# Patient Record
Sex: Female | Born: 1995 | Race: White | Hispanic: No | Marital: Married | Smoking: Never smoker
Health system: Southern US, Community
[De-identification: ages and names within clinical notes are randomized; demographics above are authoritative.]

## PROBLEM LIST (undated history)

## (undated) DIAGNOSIS — Z8489 Family history of other specified conditions: Secondary | ICD-10-CM

## (undated) DIAGNOSIS — J353 Hypertrophy of tonsils with hypertrophy of adenoids: Secondary | ICD-10-CM

## (undated) DIAGNOSIS — T4145XA Adverse effect of unspecified anesthetic, initial encounter: Secondary | ICD-10-CM

## (undated) DIAGNOSIS — T8859XA Other complications of anesthesia, initial encounter: Secondary | ICD-10-CM

## (undated) HISTORY — PX: WISDOM TOOTH EXTRACTION: SHX21

---
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
## 2016-03-30 ENCOUNTER — Emergency Department
Admission: EM | Admit: 2016-03-30 | Discharge: 2016-03-30 | Disposition: A | Discharge status: Home / Self Care | Payer: Self-pay | Attending: Student in an Organized Health Care Education/Training Program | Admitting: Student in an Organized Health Care Education/Training Program

## 2016-03-30 ENCOUNTER — Encounter: Payer: Self-pay | Admitting: Emergency Medicine

## 2016-03-30 DIAGNOSIS — R0989 Other specified symptoms and signs involving the circulatory and respiratory systems: Secondary | ICD-10-CM | POA: Insufficient documentation

## 2016-03-30 DIAGNOSIS — J029 Acute pharyngitis, unspecified: Secondary | ICD-10-CM

## 2016-03-30 DIAGNOSIS — R09A2 Foreign body sensation, throat: Secondary | ICD-10-CM

## 2016-03-30 LAB — POCT RAPID STREP A: STREPTOCOCCUS, GROUP A SCREEN (DIRECT): NEGATIVE

## 2016-03-30 MED ORDER — FIRST-DUKES MOUTHWASH MT SUSP: 5.0000 mL | Freq: Four times a day (QID) | OROMUCOSAL | 0 refills | Status: DC | PRN

## 2016-03-30 NOTE — ED Provider Notes (Signed)
[facility] Emergency Department Provider Note  ____________________________________________  Time seen: Approximately 3:37 PM  I have reviewed the triage vital signs and the nursing notes.   HISTORY  Chief Complaint Sore Throat    HPI Kaitlyn Hamilton is a 20 y.o. female , NAD, presents to the emergency department with 1 month history of irritated throat. Patient states she had a popcorn kernel stuck in the right tonsil approximately one month ago. Saw her dentist to attempted to irrigate and flush the kernel out of the tonsil but they're uncertain if it was removed. Patient notes she's had irritation about the right side of the throat since that time. Has not noted any redness or swelling. Does feel a foreign body sensation when she swallows at times but has had no difficulty eating or drinking. Has not had any fevers, chills, body aches. No shortness of breath or wheezing. Denies chest pain or neck pain.   History reviewed. No pertinent past medical history.  Patient Active Problem List   Diagnosis Date Noted  . KNEE INJURY 08/26/2009  . EPIGASTRIC PAIN 07/12/2007  . CONTUSION, FINGER 12/25/2006    History reviewed. No pertinent surgical history.  Prior to Admission medications   Medication Sig Start Date End Date Taking? Authorizing Provider  Diphenhyd-Hydrocort-Nystatin (FIRST-DUKES MOUTHWASH) SUSP Use as directed 5 mLs in the mouth or throat 4 (four) times daily as needed (for throat pain). 03/30/16   Saamiya Jeppsen L Mikinzie Maciejewski, PA-C    Allergies Review of patient's allergies indicates no known allergies.  History reviewed. No pertinent family history.  Social History Social History  Substance Use Topics  . Smoking status: Never Smoker  . Smokeless tobacco: Never Used  . Alcohol use Yes     Review of Systems  Constitutional: No fever/chills, fatigue Eyes: No visual changes.  ENT: Positive foreign body sensation in throat, sore throat. No swelling  about the throat/lips/tongue, postnasal drip Cardiovascular: No chest pain. Respiratory: No cough. No shortness of breath. No wheezing.  Gastrointestinal: No abdominal pain.  No nausea, vomiting.   Musculoskeletal: Negative for neck pain, general myalgias.  Skin: Negative for rash, redness, swelling, bruising, skin sores Neurological: Negative for headaches, focal weakness or numbness. No tingling 10-point ROS otherwise negative.  ____________________________________________   PHYSICAL EXAM:  VITAL SIGNS: ED Triage Vitals  Enc Vitals Group     BP 03/30/16 1529 118/75     Pulse Rate 03/30/16 1529 94     Resp 03/30/16 1529 18     Temp 03/30/16 1529 98.6 F (37 C)     Temp Source 03/30/16 1529 Oral     SpO2 03/30/16 1529 100 %     Weight 03/30/16 1529 150 lb (68 kg)     Height 03/30/16 1529 5\' 3"  (1.6 m)     Head Circumference --      Peak Flow --      Pain Score 03/30/16 1521 3     Pain Loc --      Pain Edu? --      Excl. in GC? --      Constitutional: Alert and oriented. Well appearing and in no acute distress. Eyes: Conjunctivae are normal without injection or icterus. PERRL. EOMI without pain.  Head: Atraumatic. ENT:      Nose: No congestion/rhinnorhea.      Mouth/Throat: Airways pain. Pharynx without erythema, swelling, exudate. Bilateral tonsils with mild swelling but no erythema or a headache. Uvula is midline. Normal motion of the soft palate with  speaking. Mucous membranes are moist.  Neck: No stridor. No carotid bruits. Supple with full range of motion Hematological/Lymphatic/Immunilogical: Positive bilateral, anterior, focal cervical lymphadenopathy without pain to palpation and are mobile. Cardiovascular: Normal rate, regular rhythm. Normal S1 and S2.  Good peripheral circulation. Respiratory: Normal respiratory effort without tachypnea or retractions. Lungs CTAB with breath sounds noted in all lung fields. Musculoskeletal: No lower extremity tenderness nor  edema.  No joint effusions. Neurologic:  Normal speech and language. No gross focal neurologic deficits are appreciated.  Skin:  Skin is warm, dry and intact. No rash noted. Psychiatric: Mood and affect are normal. Speech and behavior are normal. Patient exhibits appropriate insight and judgement.   ____________________________________________   LABS (all labs ordered are listed, but only abnormal results are displayed)  Labs Reviewed  POCT RAPID STREP A   ____________________________________________  EKG  None ____________________________________________  RADIOLOGY  None ____________________________________________    PROCEDURES  Procedure(s) performed: None   Procedures   Medications - No data to display   ____________________________________________   INITIAL IMPRESSION / ASSESSMENT AND PLAN / ED COURSE  Pertinent labs & imaging results that were available during my care of the patient were reviewed by me and considered in my medical decision making (see chart for details).  Clinical Course    Patient's diagnosis is consistent with foreign body sensation in throat and sore throat. Initially ultrasound of the neck soft tissues was ordered to evaluate the patient's symptoms. Received a phone call from radiology stating that the radiologist recommended CT with contrast to evaluate for any foreign bodies of the neck. I discussed this option with the patient as well as relayed my hesitancy in completing a contrasted CT study for her symptoms when her physical exam and vital signs were without abnormalities. Patient has been well appearing and without significant illness throughout this time and I believe a CT scan is not warranted and is too invasive at this time. Patient agrees with this plan and is amenable to see ENT in follow-up so that a less invasive but more specified evaluation can be completed. Patient will be discharged home with prescriptions for first Dukes  mouthwash to use as directed. Patient is to follow up with Dr. Willeen Cass and ENT within 48 hours for further evaluation and treatment. Patient is given ED precautions to return to the ED for any worsening or new symptoms.    ____________________________________________  FINAL CLINICAL IMPRESSION(S) / ED DIAGNOSES  Final diagnoses:  Foreign body sensation in throat  Sore throat      NEW MEDICATIONS STARTED DURING THIS VISIT:  New Prescriptions   DIPHENHYD-HYDROCORT-NYSTATIN (FIRST-DUKES MOUTHWASH) SUSP    Use as directed 5 mLs in the mouth or throat 4 (four) times daily as needed (for throat pain).         Hope Pigeon, PA-C 03/30/16 1634    Willy Eddy, MD 03/30/16 [phone]

## 2016-03-30 NOTE — ED Triage Notes (Signed)
Pt to ed with c/o sore throat x 1 month.

## 2016-03-30 NOTE — ED Notes (Addendum)
Pt states about 1 month ago she got a popcorn kernel stuck in her throat.  Reports her dentist attempted to remove it but since she has had increased pain and swelling to the back of her throat. Pt with noted large amount of swelling to back of throat and tonsils.  Pt reports difficulty swallowing. Denies difficulty breathing.

## 2016-03-30 NOTE — Discharge Instructions (Signed)
Please see ENT in follow up within 48 hours for further evaluation.

## 2016-04-28 ENCOUNTER — Encounter: Payer: Self-pay | Admitting: *Deleted

## 2016-05-06 ENCOUNTER — Ambulatory Visit: Payer: Self-pay | Admitting: Anesthesiology

## 2016-05-06 ENCOUNTER — Ambulatory Visit
Admission: RE | Admit: 2016-05-06 | Discharge: 2016-05-06 | Disposition: A | Discharge status: Home / Self Care | Payer: Self-pay | Source: Ambulatory Visit | Attending: Unknown Physician Specialty | Admitting: Unknown Physician Specialty

## 2016-05-06 ENCOUNTER — Encounter
Admission: RE | Disposition: A | Discharge status: Home / Self Care | Payer: Self-pay | Source: Ambulatory Visit | Attending: Unknown Physician Specialty

## 2016-05-06 DIAGNOSIS — J039 Acute tonsillitis, unspecified: Secondary | ICD-10-CM | POA: Insufficient documentation

## 2016-05-06 HISTORY — DX: Hypertrophy of tonsils with hypertrophy of adenoids: J35.3

## 2016-05-06 HISTORY — PX: TONSILLECTOMY AND ADENOIDECTOMY: SHX28

## 2016-05-06 HISTORY — DX: Family history of other specified conditions: Z84.89

## 2016-05-06 SURGERY — TONSILLECTOMY AND ADENOIDECTOMY
Anesthesia: General | Site: Throat | Wound class: Clean Contaminated

## 2016-05-06 MED ORDER — OXYCODONE HCL 5 MG/5ML PO SOLN
5.0000 mg | Freq: Once | ORAL | Status: AC | PRN
  Administered 2016-05-06: 5 mg via ORAL

## 2016-05-06 MED ORDER — SCOPOLAMINE 1 MG/3DAYS TD PT72
1.0000 | MEDICATED_PATCH | TRANSDERMAL | Status: DC
  Administered 2016-05-06: 1.5 mg via TRANSDERMAL

## 2016-05-06 MED ORDER — ACETAMINOPHEN 10 MG/ML IV SOLN
1000.0000 mg | Freq: Once | INTRAVENOUS | Status: AC
  Administered 2016-05-06: 1000 mg via INTRAVENOUS

## 2016-05-06 MED ORDER — LIDOCAINE HCL (CARDIAC) 20 MG/ML IV SOLN
INTRAVENOUS | Status: DC | PRN
  Administered 2016-05-06: 50 mg via INTRAVENOUS

## 2016-05-06 MED ORDER — SUCCINYLCHOLINE CHLORIDE 20 MG/ML IJ SOLN
INTRAMUSCULAR | Status: DC | PRN
  Administered 2016-05-06: 100 mg via INTRAVENOUS

## 2016-05-06 MED ORDER — LACTATED RINGERS IV SOLN
INTRAVENOUS | Status: DC
  Administered 2016-05-06: 07:00:00 via INTRAVENOUS

## 2016-05-06 MED ORDER — FENTANYL CITRATE (PF) 100 MCG/2ML IJ SOLN
INTRAMUSCULAR | Status: DC | PRN
  Administered 2016-05-06 (×3): 25 ug via INTRAVENOUS
  Administered 2016-05-06: 50 ug via INTRAVENOUS
  Administered 2016-05-06: 25 ug via INTRAVENOUS

## 2016-05-06 MED ORDER — BUPIVACAINE HCL (PF) 0.5 % IJ SOLN
INTRAMUSCULAR | Status: DC | PRN
  Administered 2016-05-06: 6 mL

## 2016-05-06 MED ORDER — OXYCODONE HCL 5 MG PO TABS: 5.0000 mg | ORAL_TABLET | Freq: Once | ORAL | Status: AC | PRN

## 2016-05-06 MED ORDER — DEXAMETHASONE SODIUM PHOSPHATE 4 MG/ML IJ SOLN
INTRAMUSCULAR | Status: DC | PRN
  Administered 2016-05-06: 10 mg via INTRAVENOUS

## 2016-05-06 MED ORDER — ONDANSETRON HCL 4 MG/2ML IJ SOLN
4.0000 mg | Freq: Once | INTRAMUSCULAR | Status: AC | PRN
  Administered 2016-05-06: 4 mg via INTRAVENOUS

## 2016-05-06 MED ORDER — PROPOFOL 10 MG/ML IV BOLUS
INTRAVENOUS | Status: DC | PRN
  Administered 2016-05-06: 200 mg via INTRAVENOUS

## 2016-05-06 MED ORDER — HYDROCODONE-ACETAMINOPHEN 7.5-325 MG/15ML PO SOLN: 15.0000 mL | ORAL | 0 refills | Status: DC | PRN

## 2016-05-06 MED ORDER — LACTATED RINGERS IV SOLN: 500.0000 mL | INTRAVENOUS | Status: DC

## 2016-05-06 MED ORDER — HYDROMORPHONE HCL 1 MG/ML IJ SOLN
0.2500 mg | INTRAMUSCULAR | Status: DC | PRN
  Administered 2016-05-06 (×2): 0.3 mg via INTRAVENOUS
  Administered 2016-05-06: 0.2 mg via INTRAVENOUS

## 2016-05-06 MED ORDER — MIDAZOLAM HCL 5 MG/5ML IJ SOLN
INTRAMUSCULAR | Status: DC | PRN
  Administered 2016-05-06: 2 mg via INTRAVENOUS

## 2016-05-06 SURGICAL SUPPLY — 22 items
CANISTER SUCT 1200ML W/VALVE (MISCELLANEOUS) ×3 IMPLANT
CATH RUBBER RED 8F (CATHETERS) ×3 IMPLANT
COAG SUCT 10F 3.5MM HAND CTRL (MISCELLANEOUS) ×3 IMPLANT
DRAPE HEAD BAR (DRAPES) ×3 IMPLANT
ELECT CAUTERY BLADE TIP 2.5 (TIP) ×3
ELECTRODE CAUTERY BLDE TIP 2.5 (TIP) ×1 IMPLANT
GLOVE BIO SURGEON STRL SZ7.5 (GLOVE) ×5 IMPLANT
HANDLE SUCTION POOLE (INSTRUMENTS) ×1 IMPLANT
KIT ROOM TURNOVER OR (KITS) ×3 IMPLANT
NDL HYPO 25GX1X1/2 BEV (NEEDLE) ×1 IMPLANT
NEEDLE HYPO 25GX1X1/2 BEV (NEEDLE) ×3 IMPLANT
NS IRRIG 500ML POUR BTL (IV SOLUTION) ×3 IMPLANT
PACK TONSIL/ADENOIDS (PACKS) ×3 IMPLANT
PAD GROUND ADULT SPLIT (MISCELLANEOUS) ×3 IMPLANT
PENCIL ELECTRO HAND CTR (MISCELLANEOUS) ×3 IMPLANT
SOL ANTI-FOG 6CC FOG-OUT (MISCELLANEOUS) ×1 IMPLANT
SOL FOG-OUT ANTI-FOG 6CC (MISCELLANEOUS) ×2
SPONGE TONSIL 1 RF SGL (DISPOSABLE) ×3 IMPLANT
STRAP BODY AND KNEE 60X3 (MISCELLANEOUS) ×3 IMPLANT
SUCTION POOLE HANDLE (INSTRUMENTS) ×3
SYR 5ML LL (SYRINGE) ×3 IMPLANT
SYRINGE 10CC LL (SYRINGE) IMPLANT

## 2016-05-06 NOTE — Anesthesia Preprocedure Evaluation (Signed)
Anesthesia Evaluation  Patient identified by MRN, date of birth, ID band Patient awake    Reviewed: Allergy & Precautions, H&P , NPO status , Patient's Chart, lab work & pertinent test results, reviewed documented beta blocker date and time   History of Anesthesia Complications (+) Family history of anesthesia reaction  Airway Mallampati: II  TM Distance: >3 FB Neck ROM: full    Dental no notable dental hx.    Pulmonary neg pulmonary ROS,    Pulmonary exam normal breath sounds clear to auscultation       Cardiovascular Exercise Tolerance: Good negative cardio ROS   Rhythm:regular Rate:Normal     Neuro/Psych negative neurological ROS  negative psych ROS   GI/Hepatic negative GI ROS, Neg liver ROS,   Endo/Other  negative endocrine ROS  Renal/GU negative Renal ROS  negative genitourinary   Musculoskeletal negative musculoskeletal ROS (+)   Abdominal   Peds negative pediatric ROS (+)  Hematology negative hematology ROS (+)   Anesthesia Other Findings   Reproductive/Obstetrics negative OB ROS                             Anesthesia Physical Anesthesia Plan  ASA: II  Anesthesia Plan: General   Post-op Pain Management:    Induction:   Airway Management Planned:   Additional Equipment:   Intra-op Plan:   Post-operative Plan:   Informed Consent: I have reviewed the patients History and Physical, chart, labs and discussed the procedure including the risks, benefits and alternatives for the proposed anesthesia with the patient or authorized representative who has indicated his/her understanding and acceptance.     Plan Discussed with: CRNA  Anesthesia Plan Comments:         Anesthesia Quick Evaluation

## 2016-05-06 NOTE — Discharge Instructions (Signed)
T & A INSTRUCTION SHEET - [facility] °[facility] ° °CREIGHTON VAUGHT, MD °PAUL H. JUENGEL, MD  °P. SCOTT BENNETT °CHAPMAN MCQUEEN, MD ° °[address] , [place] [address] TEL. [phone] °[address] [place] [address] [phone] ° °INFORMATION SHEET FOR A TONSILLECTOMY AND ADENDOIDECTOMY ° °About Your Tonsils and Adenoids ° The tonsils and adenoids are normal body tissues that are part of our immune system.  They normally help to protect us against diseases that may enter our mouth and nose.  However, sometimes the tonsils and/or adenoids become too large and obstruct our breathing, especially at night. °  ° If either of these things happen it helps to remove the tonsils and adenoids in order to become healthier. The operation to remove the tonsils and adenoids is called a tonsillectomy and adenoidectomy. ° °The Location of Your Tonsils and Adenoids ° The tonsils are located in the back of the throat on both side and sit in a cradle of muscles. The adenoids are located in the roof of the mouth, behind the nose, and closely associated with the opening of the Eustachian tube to the ear. ° °Surgery on Tonsils and Adenoids ° A tonsillectomy and adenoidectomy is a short operation which takes about thirty minutes.  This includes being put to sleep and being awakened.  Tonsillectomies and adenoidectomies are performed at [facility] and may require observation period in the recovery room prior to going home. ° °Following the Operation for a Tonsillectomy ° A cautery machine is used to control bleeding.  Bleeding from a tonsillectomy and adenoidectomy is minimal and postoperatively the risk of bleeding is approximately four percent, although this rarely life threatening. ° ° ° °After your tonsillectomy and adenoidectomy post-op care at home: ° °1. Our patients are able to go home the same day.  You may be given prescriptions for pain  medications and antibiotics, if indicated. °2. It is extremely important to remember that fluid intake is of utmost importance after a tonsillectomy.  The amount that you drink must be maintained in the postoperative period.  A good indication of whether a child is getting enough fluid is whether his/her urine output is constant.  As long as children are urinating or wetting their diaper every 6 - 8 hours this is usually enough fluid intake.   °3. Although rare, this is a risk of some bleeding in the first ten days after surgery.  This is usually occurs between day five and nine postoperatively.  This risk of bleeding is approximately four percent.  If you or your child should have any bleeding you should remain calm and notify our office or go directly to the Emergency Room at [facility] where they will contact us. Our doctors are available seven days a week for notification.  We recommend sitting up quietly in a chair, place an ice pack on the front of the neck and spitting out the blood gently until we are able to contact you.  Adults should gargle gently with ice water and this may help stop the bleeding.  If the bleeding does not stop after a short time, i.e. 10 to 15 minutes, or seems to be increasing again, please contact us or go to the hospital.   °4. It is common for the pain to be worse at 5 - 7 days postoperatively.  This occurs because the “scab” is peeling off and the mucous membrane (skin of   the throat) is growing back where the tonsils were.   °5. It is common for a low-grade fever, less than 102, during the first week after a tonsillectomy and adenoidectomy.  It is usually due to not drinking enough liquids, and we suggest your use liquid Tylenol or the pain medicine with Tylenol prescribed in order to keep your temperature below 102.  Please follow the directions on the back of the bottle. °6. Do not take aspirin or any products that contain aspirin such as Bufferin, Anacin,  Ecotrin, aspirin gum, Goodies, BC headache powders, etc., after a T&A because it can promote bleeding.  Please check with our office before administering any other medication that may been prescribed by other doctors during the two week post-operative period. °7. If you happen to look in the mirror or into your child’s mouth you will see white/gray patches on the back of the throat.  This is what a scab looks like in the mouth and is normal after having a T&A.  It will disappear once the tonsil area heals completely. However, it may cause a noticeable odor, and this too will disappear with time.     °8. You or your child may experience ear pain after having a T&A.  This is called referred pain and comes from the throat, but it is felt in the ears.  Ear pain is quite common and expected.  It will usually go away after ten days.  There is usually nothing wrong with the ears, and it is primarily due to the healing area stimulating the nerve to the ear that runs along the side of the throat.  Use either the prescribed pain medicine or Tylenol as needed.  °9. The throat tissues after a tonsillectomy are obviously sensitive.  Smoking around children who have had a tonsillectomy significantly increases the risk of bleeding.  DO NOT SMOKE!  ° °General Anesthesia, Adult, Care After °Refer to this sheet in the next few weeks. These instructions provide you with information on caring for yourself after your procedure. Your health care provider may also give you more specific instructions. Your treatment has been planned according to current medical practices, but problems sometimes occur. Call your health care provider if you have any problems or questions after your procedure. °WHAT TO EXPECT AFTER THE PROCEDURE °After the procedure, it is typical to experience: °· Sleepiness. °· Nausea and vomiting. °HOME CARE INSTRUCTIONS °· For the first 24 hours after general anesthesia: °¨ Have a responsible person with you. °¨ Do not  drive a car. If you are alone, do not take public transportation. °¨ Do not drink alcohol. °¨ Do not take medicine that has not been prescribed by your health care provider. °¨ Do not sign important papers or make important decisions. °¨ You may resume a normal diet and activities as directed by your health care provider. °· Change bandages (dressings) as directed. °· If you have questions or problems that seem related to general anesthesia, call the hospital and ask for the anesthetist or anesthesiologist on call. °SEEK MEDICAL CARE IF: °· You have nausea and vomiting that continue the day after anesthesia. °· You develop a rash. °SEEK IMMEDIATE MEDICAL CARE IF:  °· You have difficulty breathing. °· You have chest pain. °· You have any allergic problems. °  °This information is not intended to replace advice given to you by your health care provider. Make sure you discuss any questions you have with your health care provider. °  °Document   Released: 11/14/2000 Document Revised: 08/29/2014 Document Reviewed: 12/07/2011 Elsevier Interactive Patient Education 2016 [employer]    Scopolamine skin patches What is this medicine? SCOPOLAMINE (skoe POL a meen) is used to prevent nausea and vomiting caused by motion sickness, anesthesia and surgery. This medicine may be used for other purposes; ask your health care provider or pharmacist if you have questions. What should I tell my health care provider before I take this medicine? They need to know if you have any of these conditions: -glaucoma -kidney or liver disease -an unusual or allergic reaction (especially skin allergy) to scopolamine, atropine, other medicines, foods, dyes, or preservatives -pregnant or trying to get pregnant -breast-feeding How should I use this medicine? This medicine is for external use only. Follow the directions on the prescription label. One patch contains enough medicine to prevent motion sickness for up to 3 days. Apply  the patch at least 4 hours before you need it and only wear one disc at a time. Choose an area behind the ear, that is clean, dry, hairless and free from any cuts or irritation. Wipe the area with a clean dry tissue. Peel off the plastic backing of the skin patch, trying not to touch the adhesive side with your hands. Do not cut the patches. Firmly apply to the area you have chosen, with the metallic side of the patch to the skin and the tan-colored side showing. Once firmly in place, wash your hands well with soap and water. Remove the disc after 3 days, or sooner if you no longer need it. After removing the patch, wash your hands and the area behind your ear thoroughly with soap and water. The patch will still contain some medicine after use. To avoid accidental contact or ingestion by children or pets, fold the used patch in half with the sticky side together and throw away in the trash out of the reach of children and pets. If you need to use a second patch after you remove the first, place it behind the other ear. Talk to your pediatrician regarding the use of this medicine in children. Special care may be needed. Overdosage: If you think you have taken too much of this medicine contact a poison control center or emergency room at once. NOTE: This medicine is only for you. Do not share this medicine with others. What if I miss a dose? Make sure you apply the patch at least 4 hours before you need it. You can apply it the night before traveling. What may interact with this medicine? -benztropine -bethanechol -medicines for anxiety or sleeping problems like diazepam or temazepam -medicines for hay fever and other allergies -medicines for mental depression -muscle relaxants This list may not describe all possible interactions. Give your health care provider a list of all the medicines, herbs, non-prescription drugs, or dietary supplements you use. Also tell them if you smoke, drink alcohol, or use  illegal drugs. Some items may interact with your medicine. What should I watch for while using this medicine? Keep the patch dry, if possible, to prevent it from falling off. Limited contact with water, however, as in bathing or swimming, will not affect the system. If the patch falls off, throw it away and put a new one behind the other ear. You may get drowsy or dizzy. Do not drive, use machinery, or do anything that needs mental alertness until you know how this medicine affects you. Do not stand or sit up quickly, especially if you are an  older patient. This reduces the risk of dizzy or fainting spells. Alcohol may interfere with the effect of this medicine. Avoid alcoholic drinks. Your mouth may get dry. Chewing sugarless gum or sucking hard candy, and drinking plenty of water may help. Contact your doctor if the problem does not go away or is severe. This medicine may cause dry eyes and blurred vision. If you wear contact lenses you may feel some discomfort. Lubricating drops may help. See your eye doctor if the problem does not go away or is severe. If you are going to have a magnetic resonance imaging (MRI) procedure, tell your MRI technician if you have this patch on your body. It must be removed before a MRI. What side effects may I notice from receiving this medicine? Side effects that you should report to your doctor or health care professional as soon as possible: -agitation, nervousness, confusion -blurred vision and other eye problems -dizziness, drowsiness -eye pain or redness in the whites of the eye -hallucinations -pain or difficulty passing urine -skin rash, itching -vomiting Side effects that usually do not require medical attention (report to your doctor or health care professional if they continue or are bothersome): -headache -nausea This list may not describe all possible side effects. Call your doctor for medical advice about side effects. You may report side effects to  FDA at 1-800-FDA-1088. Where should I keep my medicine? Keep out of the reach of children. Store at room temperature between 20 and 25 degrees C (68 and 77 degrees F). Throw away any unused medicine after the expiration date. When you remove a patch, fold it and throw it in the trash as described above. NOTE: This sheet is a summary. It may not cover all possible information. If you have questions about this medicine, talk to your doctor, pharmacist, or health care provider.    2016, Elsevier/Gold Standard. (2012-01-05 13:31:48)

## 2016-05-06 NOTE — Anesthesia Procedure Notes (Signed)
Procedure Name: Intubation Date/Time: 05/06/2016 7:38 AM Performed by: Jimmy PicketAMYOT, Dilpreet Faires Pre-anesthesia Checklist: Patient identified, Emergency Drugs available, Suction available, Patient being monitored and Timeout performed Patient Re-evaluated:Patient Re-evaluated prior to inductionOxygen Delivery Method: Circle system utilized Preoxygenation: Pre-oxygenation with 100% oxygen Intubation Type: IV induction Ventilation: Mask ventilation without difficulty Laryngoscope Size: Miller and 2 Grade View: Grade I Tube type: Oral Rae Tube size: 7.0 mm Number of attempts: 1 Placement Confirmation: ETT inserted through vocal cords under direct vision,  positive ETCO2 and breath sounds checked- equal and bilateral Tube secured with: Tape Dental Injury: Teeth and Oropharynx as per pre-operative assessment

## 2016-05-06 NOTE — Transfer of Care (Signed)
Immediate Anesthesia Transfer of Care Note  Patient: Kaitlyn Hamilton  Procedure(s) Performed: Procedure(s): TONSILLECTOMY AND ADENOIDECTOMY (N/A)  Patient Location: PACU  Anesthesia Type: General  Level of Consciousness: awake, alert  and patient cooperative  Airway and Oxygen Therapy: Patient Spontanous Breathing and Patient connected to supplemental oxygen  Post-op Assessment: Post-op Vital signs reviewed, Patient's Cardiovascular Status Stable, Respiratory Function Stable, Patent Airway and No signs of Nausea or vomiting  Post-op Vital Signs: Reviewed and stable  Complications: No apparent anesthesia complications

## 2016-05-06 NOTE — H&P (Signed)
  H+P  Reviewed and will be scanned in later. No changes noted. 

## 2016-05-06 NOTE — Op Note (Signed)
PREOPERATIVE DIAGNOSIS:  TONSILLITIS  POSTOPERATIVE DIAGNOSIS: Same  OPERATION:  Tonsillectomy and adenoidectomy.  SURGEON:  Davina Pokehapman T. Kayliana Codd, MD  ANESTHESIA:  General endotracheal.  OPERATIVE FINDINGS:  Large tonsils and adenoids.  DESCRIPTION OF THE PROCEDURE:  Kaitlyn Hamilton was identified in the holding area and taken to the operating room and placed in the supine position.  After general endotracheal anesthesia, the table was turned 45 degrees and the patient was draped in the usual fashion for a tonsillectomy.  A mouth gag was inserted into the oral cavity and examination of the oropharynx showed the uvula was non-bifid.  There was no evidence of submucous cleft to the palate.  There were large tonsils.  A red rubber catheter was placed through the nostril.  Examination of the nasopharynx showed large obstructing adenoids.  Under indirect vision with the mirror, an adenotome was placed in the nasopharynx.  The adenoids were curetted free.  Reinspection with a mirror showed excellent removal of the adenoid.  Nasopharyngeal packs were then placed.  The operation then turned to the tonsillectomy.  Beginning on the left-hand side a tenaculum was used to grasp the tonsil and the Bovie cautery was used to dissect it free from the fossa.  In a similar fashion, the right tonsil was removed.  Meticulous hemostasis was achieved using the Bovie cautery.  With both tonsils removed and no active bleeding, the nasopharyngeal packs were removed.  Suction cautery was then used to cauterize the nasopharyngeal bed to prevent bleeding.  The red rubber catheter was removed with no active bleeding.  0.5% plain Marcaine was used to inject the anterior and posterior tonsillar pillars bilaterally.  A total of 6ml was used.  The patient tolerated the procedure well and was awakened in the operating room and taken to the recovery room in stable condition.   CULTURES:  None.  SPECIMENS:  Tonsils and  adenoids.  ESTIMATED BLOOD LOSS:  Less than 20 ml.  Kaitlyn Hamilton T  05/06/2016  7:56 AM

## 2016-05-06 NOTE — Anesthesia Postprocedure Evaluation (Signed)
Anesthesia Post Note  Patient: Kaitlyn Hamilton  Procedure(s) Performed: Procedure(s) (LRB): TONSILLECTOMY AND ADENOIDECTOMY (N/A)  Patient location during evaluation: PACU Anesthesia Type: General Level of consciousness: awake and alert Pain management: pain level controlled Vital Signs Assessment: post-procedure vital signs reviewed and stable Respiratory status: spontaneous breathing, nonlabored ventilation and respiratory function stable Cardiovascular status: blood pressure returned to baseline and stable Postop Assessment: no signs of nausea or vomiting Anesthetic complications: no    DANIEL D KOVACS

## 2016-05-09 ENCOUNTER — Encounter: Payer: Self-pay | Admitting: Unknown Physician Specialty

## 2016-05-10 LAB — SURGICAL PATHOLOGY

## 2016-10-03 ENCOUNTER — Encounter: Payer: Self-pay | Admitting: Emergency Medicine

## 2016-10-03 ENCOUNTER — Ambulatory Visit
Admission: EM | Admit: 2016-10-03 | Discharge: 2016-10-03 | Disposition: A | Discharge status: Home / Self Care | Payer: No Typology Code available for payment source | Attending: Emergency Medicine | Admitting: Emergency Medicine

## 2016-10-03 ENCOUNTER — Emergency Department
Admission: EM | Admit: 2016-10-03 | Discharge: 2016-10-03 | Disposition: A | Discharge status: Home / Self Care | Payer: Self-pay | Attending: Emergency Medicine | Admitting: Emergency Medicine

## 2016-10-03 DIAGNOSIS — Y92810 Car as the place of occurrence of the external cause: Secondary | ICD-10-CM | POA: Insufficient documentation

## 2016-10-03 DIAGNOSIS — T7421XA Adult sexual abuse, confirmed, initial encounter: Secondary | ICD-10-CM | POA: Insufficient documentation

## 2016-10-03 DIAGNOSIS — X58XXXA Exposure to other specified factors, initial encounter: Secondary | ICD-10-CM | POA: Insufficient documentation

## 2016-10-03 DIAGNOSIS — Z0441 Encounter for examination and observation following alleged adult rape: Secondary | ICD-10-CM | POA: Diagnosis present

## 2016-10-03 DIAGNOSIS — Y9389 Activity, other specified: Secondary | ICD-10-CM | POA: Insufficient documentation

## 2016-10-03 DIAGNOSIS — Z79899 Other long term (current) drug therapy: Secondary | ICD-10-CM | POA: Insufficient documentation

## 2016-10-03 DIAGNOSIS — Y999 Unspecified external cause status: Secondary | ICD-10-CM | POA: Insufficient documentation

## 2016-10-03 LAB — POCT PREGNANCY, URINE: Preg Test, Ur: NEGATIVE

## 2016-10-03 MED ORDER — AZITHROMYCIN 500 MG PO TABS
ORAL_TABLET | ORAL | Status: AC
  Administered 2016-10-03: 500 mg via ORAL
  Filled 2016-10-03: qty 2

## 2016-10-03 MED ORDER — LIDOCAINE HCL (PF) 1 % IJ SOLN
INTRAMUSCULAR | Status: AC
  Filled 2016-10-03: qty 5

## 2016-10-03 MED ORDER — METRONIDAZOLE 500 MG PO TABS: 500.0000 mg | ORAL_TABLET | Freq: Once | ORAL | Status: DC

## 2016-10-03 MED ORDER — PROMETHAZINE HCL 25 MG PO TABS: 25.0000 mg | ORAL_TABLET | Freq: Once | ORAL | Status: DC

## 2016-10-03 MED ORDER — AZITHROMYCIN 500 MG PO TABS: 500.0000 mg | ORAL_TABLET | Freq: Once | ORAL | Status: DC

## 2016-10-03 MED ORDER — ULIPRISTAL ACETATE 30 MG PO TABS
ORAL_TABLET | ORAL | Status: AC
  Administered 2016-10-03: 30 mg
  Filled 2016-10-03: qty 1

## 2016-10-03 MED ORDER — CEFTRIAXONE SODIUM 250 MG IJ SOLR
INTRAMUSCULAR | Status: AC
  Administered 2016-10-03: 250 mg via INTRAMUSCULAR
  Filled 2016-10-03: qty 250
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
## 2016-10-03 NOTE — ED Notes (Signed)
Sane nurse Beecher McardleJacque called in for pt exam.
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
## 2017-05-08 ENCOUNTER — Telehealth: Payer: Self-pay

## 2017-05-08 MED ORDER — NORETHIN ACE-ETH ESTRAD-FE 1-20 MG-MCG(24) PO CAPS: 1.0000 | ORAL_CAPSULE | Freq: Every day | ORAL | 0 refills | Status: DC

## 2017-05-08 NOTE — Telephone Encounter (Signed)
Pt received a call. She realized she forgot to leave the Pharmacy to send rx to. [facility] uses [facility]

## 2017-05-08 NOTE — Telephone Encounter (Signed)
Pt has annual sched for the 24th but will run out of taytula before then.  Requesting refill.  Pt's mailbox is full.  In [place] is [employer] [facility] so refill eRx'd to them.

## 2017-05-09 MED ORDER — NORETHIN ACE-ETH ESTRAD-FE 1-20 MG-MCG(24) PO CAPS: 1.0000 | ORAL_CAPSULE | Freq: Every day | ORAL | 0 refills | Status: DC

## 2017-05-09 NOTE — Telephone Encounter (Signed)
Pt is calling about her prescription refill . Please advise

## 2017-05-09 NOTE — Telephone Encounter (Signed)
Pharm changed in pt's chart, [facility]aytula [facility] [address] and was received.  Pt's mailbox is full.

## 2017-05-09 NOTE — Addendum Note (Signed)
Addended by: Loran Senters D on: 05/09/2017 03:31 PM   Modules accepted: Orders

## 2017-05-15 ENCOUNTER — Encounter: Payer: Self-pay | Admitting: Advanced Practice Midwife

## 2017-05-15 ENCOUNTER — Ambulatory Visit (INDEPENDENT_AMBULATORY_CARE_PROVIDER_SITE_OTHER): Payer: Medicaid Other | Admitting: Advanced Practice Midwife

## 2017-05-15 VITALS — BP 114/74 | Ht 63.0 in | Wt 152.0 lb

## 2017-05-15 DIAGNOSIS — Z308 Encounter for other contraceptive management: Secondary | ICD-10-CM | POA: Diagnosis not present

## 2017-05-15 DIAGNOSIS — Z3041 Encounter for surveillance of contraceptive pills: Secondary | ICD-10-CM

## 2017-05-15 DIAGNOSIS — Z01419 Encounter for gynecological examination (general) (routine) without abnormal findings: Secondary | ICD-10-CM

## 2017-05-15 DIAGNOSIS — Z113 Encounter for screening for infections with a predominantly sexual mode of transmission: Secondary | ICD-10-CM

## 2017-05-15 DIAGNOSIS — Z124 Encounter for screening for malignant neoplasm of cervix: Secondary | ICD-10-CM

## 2017-05-15 MED ORDER — NORETHIN ACE-ETH ESTRAD-FE 1-20 MG-MCG(24) PO CAPS: 1.0000 | ORAL_CAPSULE | Freq: Every day | ORAL | 11 refills | Status: DC

## 2017-05-15 NOTE — Progress Notes (Signed)
Patient ID: LEXXUS UNDERHILL, female   DOB: 01-Oct-1995, 21 y.o.   MRN: [id number]     Gynecology Annual Exam  PCP: System, Pcp Not In  Chief Complaint:  Chief Complaint  Patient presents with  . Annual Exam    History of Present Illness: Patient is a 21 y.o. G1P1001 presents for annual exam. The patient has no complaints today. She requests a refill of her birth control.  LMP: Patient's last menstrual period was 05/08/2017. Menarche:not applicable Average Interval: regular, 28 days Duration of flow: 3 days Heavy Menses: no Clots: no Intermenstrual Bleeding: no Postcoital Bleeding: no Dysmenorrhea: no  The patient is sexually active. She currently uses OCP (estrogen/progesterone) for contraception. She denies dyspareunia.  The patient does occasionally perform self breast exams.  There is no notable family history of breast or ovarian cancer in her family.  The patient wears seatbelts: yes.  The patient has regular exercise: she works two jobs and has a 21 year old but denies regular physical activity.  She tries to eat a healthy diet and admits to inadequate hydration.  The patient denies current symptoms of depression.    Review of Systems: Review of Systems  Constitutional: Negative.   HENT: Negative.   Eyes: Negative.   Respiratory: Negative.   Cardiovascular: Negative.   Gastrointestinal: Negative.   Genitourinary: Negative.   Musculoskeletal: Negative.   Skin: Negative.   Neurological: Negative.   Endo/Heme/Allergies: Negative.   Psychiatric/Behavioral: Negative.     Past Medical History:  Past Medical History:  Diagnosis Date  . Family history of adverse reaction to anesthesia    mom - PONV  . Hypertrophy of tonsils and adenoids     Past Surgical History:  Past Surgical History:  Procedure Laterality Date  . TONSILLECTOMY AND ADENOIDECTOMY N/A 05/06/2016   Procedure: TONSILLECTOMY AND ADENOIDECTOMY;  Surgeon: Linus Salmons, MD;  Location: [facility];  Service: ENT;  Laterality: N/A;  . WISDOM TOOTH EXTRACTION      Gynecologic History:  Patient's last menstrual period was 05/08/2017. Contraception: OCP (estrogen/progesterone) Last Pap: 4 years ago Results were:  no abnormalities   Obstetric History: G1P1001  Family History:  Family History  Problem Relation Age of Onset  . Breast cancer Paternal Grandmother     Social History:  Social History   Social History  . Marital status: Single    Spouse name: N/A  . Number of children: N/A  . Years of education: N/A   Occupational History  . Not on file.   Social History Main Topics  . Smoking status: Never Smoker  . Smokeless tobacco: Never Used  . Alcohol use No  . Drug use: No  . Sexual activity: Yes    Birth control/ protection: Pill   Other Topics Concern  . Not on file   Social History Narrative  . No narrative on file    Allergies:  No Known Allergies  Medications: Prior to Admission medications   Medication Sig Start Date End Date Taking? Authorizing Provider  Norethin Ace-Eth Estrad-FE (TAYTULLA) 1-20 MG-MCG(24) CAPS Take 1 tablet by mouth daily. 05/09/17  Yes Tresea Mall, CNM    Physical Exam Vitals: Blood pressure 114/74, height  (1.6 m), weight 152 lb (68.9 kg), last menstrual period 05/08/2017.  General: NAD HEENT: normocephalic, anicteric Thyroid: no enlargement, no palpable nodules Pulmonary: No increased work of breathing, CTAB Cardiovascular: RRR, distal pulses 2+ Breast: Breast symmetrical, no tenderness, no palpable nodules or masses, no skin or nipple  retraction present, no nipple discharge.  No axillary or supraclavicular lymphadenopathy. Abdomen: NABS, soft, non-tender, non-distended.  Umbilicus without lesions.  No hepatomegaly, splenomegaly or masses palpable. No evidence of hernia  Genitourinary:  External: Normal external female genitalia.  Normal urethral meatus, normal  Bartholin's and Skene's glands.     Vagina: Normal vaginal mucosa, no evidence of prolapse.    Cervix: Grossly normal in appearance, no bleeding, no CMT  Uterus: Non-enlarged, mobile, normal contour.    Adnexa: ovaries non-enlarged, no adnexal masses  Rectal: deferred  Lymphatic: no evidence of inguinal lymphadenopathy Extremities: no edema, erythema, or tenderness Neurologic: Grossly intact Psychiatric: mood appropriate, affect full   Assessment: 21 y.o. G1P1001 routine annual exam  Plan: Problem List Items Addressed This Visit    None      1) 4) Gardasil Series discussed and if applicable offered to patient - Patient has not previously completed 3 shot series   2) STI screening was offered and accepted   3) ASCCP guidelines and rational discussed.  Patient opts for yearly screening interval  4) Contraception - patient prefers to continue with current OCP  5) Encouraged increase in healthy lifestyle diet, hydration and exercise   6) Follow up 1 year for routine annual exam  Tresea Mall, CNM

## 2017-05-19 LAB — IGP,CTNGTV,APT HPV,RFX16/18,45
CHLAMYDIA, NUC. ACID AMP: NEGATIVE
GONOCOCCUS, NUC. ACID AMP: NEGATIVE
HPV APTIMA: NEGATIVE
PAP SMEAR COMMENT: 0
TRICH VAG BY NAA: NEGATIVE

## 2017-06-09 ENCOUNTER — Other Ambulatory Visit: Payer: Self-pay | Admitting: Advanced Practice Midwife

## 2017-06-09 NOTE — Telephone Encounter (Signed)
Patient also called triage to request refill on this medication. States she has a yeast infection. Advised pt I would request from [facility] but she may need to be seen. Please advise thank you.

## 2017-06-09 NOTE — Telephone Encounter (Signed)
Rx edited and approved and sent. Attempted to call patient but phone number didn't work.

## 2017-06-09 NOTE — Telephone Encounter (Signed)
Please advise 

## 2017-10-25 ENCOUNTER — Other Ambulatory Visit: Payer: Self-pay | Admitting: Advanced Practice Midwife

## 2017-10-25 DIAGNOSIS — Z3041 Encounter for surveillance of contraceptive pills: Secondary | ICD-10-CM

## 2017-10-25 MED ORDER — NORETHIN ACE-ETH ESTRAD-FE 1-20 MG-MCG PO TABS
1.0000 | ORAL_TABLET | Freq: Every day | ORAL | 6 refills | Status: DC
Start: 2017-10-25 — End: 2019-04-09

## 2017-10-25 NOTE — Progress Notes (Signed)
Changed birth control from [place] to [place]. Taytulla not covered by insurance.

## 2018-01-23 ENCOUNTER — Emergency Department
Admission: EM | Admit: 2018-01-23 | Discharge: 2018-01-23 | Disposition: A | Discharge status: Home / Self Care | Payer: BLUE CROSS/BLUE SHIELD | Attending: Emergency Medicine | Admitting: Emergency Medicine

## 2018-01-23 ENCOUNTER — Other Ambulatory Visit: Payer: Self-pay

## 2018-01-23 ENCOUNTER — Encounter: Payer: Self-pay | Admitting: Emergency Medicine

## 2018-01-23 DIAGNOSIS — N3000 Acute cystitis without hematuria: Secondary | ICD-10-CM | POA: Diagnosis not present

## 2018-01-23 DIAGNOSIS — Z79899 Other long term (current) drug therapy: Secondary | ICD-10-CM | POA: Insufficient documentation

## 2018-01-23 DIAGNOSIS — R300 Dysuria: Secondary | ICD-10-CM | POA: Diagnosis present

## 2018-01-23 LAB — URINALYSIS, COMPLETE (UACMP) WITH MICROSCOPIC
Bacteria, UA: NONE SEEN
Bilirubin Urine: NEGATIVE
Glucose, UA: NEGATIVE mg/dL
KETONES UR: 5 mg/dL — AB
Nitrite: NEGATIVE
PH: 6 (ref 5.0–8.0)
Protein, ur: 30 mg/dL — AB
RBC / HPF: >50 RBC/hpf — ABNORMAL HIGH (ref 0–5)
SPECIFIC GRAVITY, URINE: 1.018 (ref 1.005–1.030)
WBC, UA: >50 WBC/hpf — ABNORMAL HIGH (ref 0–5)

## 2018-01-23 LAB — POCT PREGNANCY, URINE: Preg Test, Ur: NEGATIVE

## 2018-01-23 MED ORDER — PHENAZOPYRIDINE HCL 100 MG PO TABS: 100.0000 mg | ORAL_TABLET | Freq: Three times a day (TID) | ORAL | 0 refills | Status: AC | PRN

## 2018-01-23 MED ORDER — PHENAZOPYRIDINE HCL 100 MG PO TABS
95.0000 mg | ORAL_TABLET | Freq: Once | ORAL | Status: DC
  Filled 2018-01-23: qty 1

## 2018-01-23 MED ORDER — CEPHALEXIN 500 MG PO CAPS: 500.0000 mg | ORAL_CAPSULE | Freq: Two times a day (BID) | ORAL | 0 refills | Status: AC

## 2018-01-23 MED ORDER — FLUCONAZOLE 100 MG PO TABS: 150.0000 mg | ORAL_TABLET | Freq: Every day | ORAL | 0 refills | Status: AC

## 2018-01-23 MED ORDER — CEPHALEXIN 500 MG PO CAPS
500.0000 mg | ORAL_CAPSULE | Freq: Once | ORAL | Status: AC
  Administered 2018-01-23: 500 mg via ORAL
  Filled 2018-01-23: qty 1

## 2018-01-23 MED ORDER — PHENAZOPYRIDINE HCL 200 MG PO TABS
200.0000 mg | ORAL_TABLET | Freq: Once | ORAL | Status: AC
  Administered 2018-01-23: 200 mg via ORAL
  Filled 2018-01-23: qty 1

## 2018-01-23 NOTE — ED Notes (Signed)
ED Provider at bedside. 

## 2018-01-23 NOTE — ED Notes (Signed)
Pt unable to obtain urine specimen at this time 

## 2018-01-23 NOTE — ED Triage Notes (Signed)
Patient ambulatory to triage with steady gait, without difficulty or distress noted; pt reports last few days having dysuria

## 2018-01-23 NOTE — ED Provider Notes (Signed)
[facility] Emergency Department Provider Note    First MD Initiated Contact with Patient 01/23/18 0424     (approximate)  I have reviewed the triage vital signs and the nursing notes.   HISTORY  Chief Complaint Dysuria    HPI Kaitlyn Hamilton is a 22 y.o. female presents to the emergency department with dysuria, urinary frequency and urgency x2 days patient denies any nausea vomiting.  Patient denies any back pain.  Patient denies any fever afebrile on presentation.  Patient denies any vaginal discharge.   Past Medical History:  Diagnosis Date  . Family history of adverse reaction to anesthesia    mom - PONV  . Hypertrophy of tonsils and adenoids     There are no active problems to display for this patient.   Past Surgical History:  Procedure Laterality Date  . CESAREAN SECTION    . TONSILLECTOMY AND ADENOIDECTOMY N/A 05/06/2016   Procedure: TONSILLECTOMY AND ADENOIDECTOMY;  Surgeon: Linus Salmonshapman McQueen, MD;  [facility];  Service: ENT;  Laterality: N/A;  . WISDOM TOOTH EXTRACTION      Prior to Admission medications   Medication Sig Start Date End Date Taking? Authorizing Provider  fluconazole (DIFLUCAN) 150 MG tablet TAKE 1 TABLET BY MOUTH. REPEAT IN 3 DAYS IF SYMPTOMS PERSIST. 06/09/17   Tresea MallGledhill, Jane, CNM  norethindrone-ethinyl estradiol (JUNEL FE,GILDESS FE,LOESTRIN FE) 1-20 MG-MCG tablet Take 1 tablet by mouth daily. 10/25/17   Tresea MallGledhill, Jane, CNM    Allergies No known drug allergies  Family History  Problem Relation Age of Onset  . Breast cancer Paternal Grandmother     Social History Social History   Tobacco Use  . Smoking status: Never Smoker  . Smokeless tobacco: Never Used  Substance Use Topics  . Alcohol use: No  . Drug use: No    Review of Systems Constitutional: No fever/chills Eyes: No visual changes. ENT: No sore throat. Cardiovascular: Denies chest pain. Respiratory: Denies shortness of  breath. Gastrointestinal: No abdominal pain.  No nausea, no vomiting.  No diarrhea.  No constipation. Genitourinary: Positive for dysuria urinary frequency and urgency Musculoskeletal: Negative for neck pain.  Negative for back pain. Integumentary: Negative for rash. Neurological: Negative for headaches, focal weakness or numbness.  ____________________________________________   PHYSICAL EXAM:  VITAL SIGNS: ED Triage Vitals [01/23/18 0413]  Enc Vitals Group     BP 110/76     Pulse Rate 79     Resp 18     Temp (!) 97.5 F (36.4 C)     Temp Source Oral     SpO2 99 %     Weight 55.3 kg (122 lb)     Height 1.6 m (5\' 3" )     Head Circumference      Peak Flow      Pain Score 6     Pain Loc      Pain Edu?      Excl. in GC?     Constitutional: Alert and oriented. Well appearing and in no acute distress. Eyes: Conjunctivae are normal. Head: Atraumatic. Mouth/Throat: Mucous membranes are moist.  Oropharynx non-erythematous. Neck: No stridor.   Cardiovascular: Normal rate, regular rhythm. Good peripheral circulation. Grossly normal heart sounds. Respiratory: Normal respiratory effort.  No retractions. Lungs CTAB. Gastrointestinal: Soft and nontender. No distention.  Musculoskeletal: No lower extremity tenderness nor edema. No gross deformities of extremities. Neurologic:  Normal speech and language. No gross focal neurologic deficits are appreciated.  Skin:  Skin is warm,  dry and intact. No rash noted.   ____________________________________________   LABS (all labs ordered are listed, but only abnormal results are displayed)  Labs Reviewed  URINALYSIS, COMPLETE (UACMP) WITH MICROSCOPIC - Abnormal; Notable for the following components:      Result Value   Color, Urine YELLOW (*)    APPearance HAZY (*)    Hgb urine dipstick LARGE (*)    Ketones, ur 5 (*)    Protein, ur 30 (*)    Leukocytes, UA LARGE (*)    RBC / HPF >50 (*)    WBC, UA >50 (*)    Non Squamous Epithelial  0-5 (*)    All other components within normal limits  POCT PREGNANCY, URINE     Procedures   ____________________________________________   INITIAL IMPRESSION / ASSESSMENT AND PLAN / ED COURSE  As part of my medical decision making, I reviewed the following data within the electronic MEDICAL RECORD NUMBER   22 year old female presented with above-stated history and physical exam concerning for urinary tract infection.  Urinalysis consistent with such.  Patient given Keflex in the emergency department and Pyridium will be prescribed the same for home.  Patient also requested Diflucan ____________________________________________  FINAL CLINICAL IMPRESSION(S) / ED DIAGNOSES  Final diagnoses:  Acute cystitis without hematuria     MEDICATIONS GIVEN DURING THIS VISIT:  Medications  cephALEXin (KEFLEX) capsule 500 mg (has no administration in time range)  phenazopyridine (PYRIDIUM) tablet 100 mg (has no administration in time range)     ED Discharge Orders    None       Note:  This document was prepared using Dragon voice recognition software and may include unintentional dictation errors.    Darci Current, MD 01/23/18 [phone]

## 2018-01-24 LAB — URINE CULTURE: Culture: NO GROWTH

## 2018-10-30 LAB — OB RESULTS CONSOLE RUBELLA ANTIBODY, IGM: Rubella: IMMUNE

## 2018-10-30 LAB — OB RESULTS CONSOLE HEPATITIS B SURFACE ANTIGEN: Hepatitis B Surface Ag: NEGATIVE

## 2019-03-10 DIAGNOSIS — O34219 Maternal care for unspecified type scar from previous cesarean delivery: Secondary | ICD-10-CM | POA: Insufficient documentation

## 2019-04-01 LAB — OB RESULTS CONSOLE VARICELLA ZOSTER ANTIBODY, IGG: Varicella: NON-IMMUNE/NOT IMMUNE

## 2019-04-03 LAB — OB RESULTS CONSOLE GC/CHLAMYDIA
Chlamydia: NEGATIVE
Gonorrhea: NEGATIVE

## 2019-04-03 LAB — OB RESULTS CONSOLE HIV ANTIBODY (ROUTINE TESTING): HIV: NONREACTIVE

## 2019-04-03 LAB — OB RESULTS CONSOLE RPR: RPR: NONREACTIVE

## 2019-04-03 LAB — OB RESULTS CONSOLE GBS: GBS: POSITIVE

## 2019-04-13 ENCOUNTER — Inpatient Hospital Stay
Admission: EM | Admit: 2019-04-13 | Discharge: 2019-04-14 | DRG: 833 | Disposition: A | Discharge status: Home / Self Care | Payer: 59 | Attending: Obstetrics & Gynecology | Admitting: Obstetrics & Gynecology

## 2019-04-13 ENCOUNTER — Other Ambulatory Visit: Payer: Self-pay

## 2019-04-13 ENCOUNTER — Encounter: Payer: Self-pay | Admitting: Certified Nurse Midwife

## 2019-04-13 DIAGNOSIS — O99820 Streptococcus B carrier state complicating pregnancy: Secondary | ICD-10-CM | POA: Diagnosis present

## 2019-04-13 DIAGNOSIS — O471 False labor at or after 37 completed weeks of gestation: Secondary | ICD-10-CM | POA: Diagnosis present

## 2019-04-13 DIAGNOSIS — O34219 Maternal care for unspecified type scar from previous cesarean delivery: Secondary | ICD-10-CM | POA: Diagnosis present

## 2019-04-13 DIAGNOSIS — Z20828 Contact with and (suspected) exposure to other viral communicable diseases: Secondary | ICD-10-CM | POA: Diagnosis present

## 2019-04-13 DIAGNOSIS — Z3A38 38 weeks gestation of pregnancy: Secondary | ICD-10-CM

## 2019-04-13 HISTORY — DX: Other complications of anesthesia, initial encounter: T88.59XA

## 2019-04-13 MED ORDER — LACTATED RINGERS IV SOLN: 500.0000 mL | INTRAVENOUS | Status: DC | PRN

## 2019-04-13 MED ORDER — ONDANSETRON HCL 4 MG/2ML IJ SOLN: 4.0000 mg | Freq: Four times a day (QID) | INTRAMUSCULAR | Status: DC | PRN

## 2019-04-13 MED ORDER — OXYCODONE-ACETAMINOPHEN 5-325 MG PO TABS: 2.0000 | ORAL_TABLET | ORAL | Status: DC | PRN

## 2019-04-13 MED ORDER — LACTATED RINGERS IV SOLN
INTRAVENOUS | Status: DC
  Administered 2019-04-14: 01:00:00 via INTRAVENOUS

## 2019-04-13 MED ORDER — OXYCODONE-ACETAMINOPHEN 5-325 MG PO TABS: 1.0000 | ORAL_TABLET | ORAL | Status: DC | PRN

## 2019-04-13 MED ORDER — SOD CITRATE-CITRIC ACID 500-334 MG/5ML PO SOLN: 30.0000 mL | ORAL | Status: DC | PRN

## 2019-04-13 MED ORDER — OXYTOCIN BOLUS FROM INFUSION: 500.0000 mL | Freq: Once | INTRAVENOUS | Status: DC

## 2019-04-13 MED ORDER — OXYTOCIN 40 UNITS IN NORMAL SALINE INFUSION - SIMPLE MED: 2.5000 [IU]/h | INTRAVENOUS | Status: DC

## 2019-04-13 MED ORDER — ACETAMINOPHEN 325 MG PO TABS: 650.0000 mg | ORAL_TABLET | ORAL | Status: DC | PRN

## 2019-04-13 NOTE — H&P (Signed)
OB History & Physical   History of Present Illness:  Chief Complaint: contractions  HPI:  Kaitlyn Hamilton is a 23 y.o. G4P1001 female at 105w1d dated by LMP c/w [redacted]w[redacted]d utlrasound. She presents to L&D with contractions for the past few days, worsening today, and coming every 5-6 minutes for the past few hours.   She reports:  -active fetal movement -no leakage of fluid -no vaginal bleeding -no contractions  Pregnancy Issues: 1. GBS bacteruria 2. Varicella nonimmune 3. Depression, on Wellbutrin 100mg  TID 4. Size > dates, u/s 02/20/2019 EFW 1648g 51% 5. History of cesarean delivery, plans repeat   Maternal Medical History:   Past Medical History:  Diagnosis Date  . Complication of anesthesia    nausea and vomiting for 3 days after delivery  . Family history of adverse reaction to anesthesia    mom - PONV  . Hypertrophy of tonsils and adenoids     Past Surgical History:  Procedure Laterality Date  . CESAREAN SECTION    . TONSILLECTOMY AND ADENOIDECTOMY N/A 05/06/2016   Procedure: TONSILLECTOMY AND ADENOIDECTOMY;  Surgeon: Beverly Gust, MD;  Location: [facility];  Service: ENT;  Laterality: N/A;  . WISDOM TOOTH EXTRACTION      Allergies  Allergen Reactions  . Anesthesia S-I-40 [Propofol] Nausea And Vomiting    Prior to Admission medications   Medication Sig Start Date End Date Taking? Authorizing Provider  buPROPion (WELLBUTRIN) 100 MG tablet Take 100 mg by mouth 2 (two) times daily.   Yes [provider]  nystatin-triamcinolone (MYCOLOG II) cream Apply 1 application topically 2 (two) times daily as needed (irritation).   Yes [provider]  Prenatal Vit-Fe Fumarate-FA (PRENATAL MULTIVITAMIN) TABS tablet Take 1 tablet by mouth daily at 12 noon.   Yes [provider]  promethazine (PHENERGAN) 25 MG tablet Take 25 mg by mouth at bedtime.   Yes [provider]     Prenatal care site: [place]  History: She  reports that she has never smoked. She has never used smokeless tobacco. She reports that she does not drink alcohol or use drugs.  Family History: family history includes Breast cancer in her paternal grandmother.   Review of Systems: A full review of systems was performed and negative except as noted in the HPI.    Physical Exam:  Vital Signs: BP 123/88 (BP Location: Left Arm)   Pulse (!) 106   Temp 98.5 F (36.9 C) (Oral)   Resp 18   Ht 5\' 3"  (1.6 m)   Wt 89.8 kg   LMP 07/20/2018   SpO2 100%   BMI 35.07 kg/m   General:   alert, cooperative and appears stated age  Skin:  normal and no rash or abnormalities  Neurologic:    Alert & oriented x 3  Lungs:   clear to auscultation bilaterally  Heart:   regular rate and rhythm, S1, S2 normal, no murmur, click, rub or gallop  Abdomen:  soft, non-tender; bowel sounds normal; no masses,  no organomegaly  Pelvis:  Exam deferred.  FHT:  135 BPM  Presentations: cephalic  Extremities: : non-tender, symmetric, no edema bilaterally.     Results for orders placed or performed during the hospital encounter of 04/13/19 (from the past 24 hour(s))  SARS Coronavirus 2 [facility] order, Performed in [facility] hospital lab) Nasopharyngeal Nasopharyngeal Swab     Status: None   Collection Time: 04/14/19 12:11 AM   Specimen: Nasopharyngeal Swab  Result  Value Ref Range   SARS Coronavirus 2 NEGATIVE NEGATIVE  CBC     Status: Abnormal   Collection Time: 04/14/19 12:50 AM  Result Value Ref Range   WBC 11.7 (H) 4.0 - 10.5 K/uL   RBC 3.83 (L) 3.87 - 5.11 MIL/uL   Hemoglobin 11.5 (L) 12.0 - 15.0 g/dL   HCT 65.733.8 (L) 84.636.0 - 96.246.0 %   MCV 88.3 80.0 - 100.0 fL   MCH 30.0 26.0 - 34.0 pg   MCHC 34.0 30.0 - 36.0 g/dL   RDW 95.213.0 84.111.5 - 32.415.5 %   Platelets 177 150 - 400 K/uL   nRBC 0.0 0.0 - 0.2 %  Type and screen [facility]     Status: None (Preliminary result)   Collection Time: 04/14/19 12:50 AM  Result Value Ref Range    ABO/RH(D) PENDING    Antibody Screen PENDING    Sample Expiration      04/17/2019,2359 Performed at [facility], [address] [place], [place] [address]     Pertinent Results:  Prenatal Labs: Blood type/Rh O+  Antibody screen neg  Rubella Immune  Varicella Non-immune  RPR NR  HBsAg Neg  HIV NR  GC neg  Chlamydia neg  Genetic screening declined  1 hour GTT 164  3 hour GTT 78, 146, 117, 57  GBS positive   FHT: FHR: 135 bpm, variability: moderate,  accelerations:  Present,  decelerations:  Absent Category/reactivity:  Category I TOCO: irregular, every 3-8 minutes   Assessment:  Kaitlyn Hamilton is a 23 y.o. 192P1001 female at 1259w1d with contractions and history of cesarean delivery.   Plan:  1. Admit to Labor & Delivery; consents reviewed and obtained  2. Fetal Well being  - Fetal Tracing: category I - GBS positive - Presentation: cephalic confirmed by Leopold's   3. Routine OB: - Prenatal labs reviewed, as above - Rh positive - CBC & T&S on admit - NPO, IVF  4. Previous cesarean delivery, desires repeat - Contractions have spaced out some since initial arrival to the hospital, with no cervical change from 2300 to 0100. Discussed with Dr. Elesa MassedWard, who advises to continue monitoring until morning, with plan to proceed to cesarean delivery prior to then if contractions become more regular and/or increase in intensity. Discussed plan of care with patient and her husband.  5. Post Partum Planning: - Infant feeding: breastfeeding - Contraception: POPs  Kaitlyn Hamilton, CNM 04/14/2019 1:35 AM ----- Kaitlyn Hamilton Certified Nurse Midwife [facility], Department of OB/GYN [facility]

## 2019-04-13 NOTE — OB Triage Note (Signed)
Pt presents c/o ctx for the past 3 days. Pt states they have became more regular since about 8pm. Pt has to bnretahe through them. Pt denies any bleeding, or LOF. Reports positive fetal movement. Vitals WNL. Will continue to monitor.

## 2019-04-14 ENCOUNTER — Other Ambulatory Visit: Payer: Self-pay

## 2019-04-14 ENCOUNTER — Encounter
Admission: EM | Disposition: A | Discharge status: Home / Self Care | Payer: Self-pay | Source: Home / Self Care | Attending: Obstetrics & Gynecology

## 2019-04-14 ENCOUNTER — Inpatient Hospital Stay: Payer: 59 | Admitting: Anesthesiology

## 2019-04-14 ENCOUNTER — Encounter: Payer: Self-pay | Admitting: Anesthesiology

## 2019-04-14 LAB — CBC
HCT: 33.8 % — ABNORMAL LOW (ref 36.0–46.0)
Hemoglobin: 11.5 g/dL — ABNORMAL LOW (ref 12.0–15.0)
MCH: 30 pg (ref 26.0–34.0)
MCHC: 34 g/dL (ref 30.0–36.0)
MCV: 88.3 fL (ref 80.0–100.0)
Platelets: 177 10*3/uL (ref 150–400)
RBC: 3.83 MIL/uL — ABNORMAL LOW (ref 3.87–5.11)
RDW: 13 % (ref 11.5–15.5)
WBC: 11.7 10*3/uL — ABNORMAL HIGH (ref 4.0–10.5)
nRBC: 0 % (ref 0.0–0.2)

## 2019-04-14 LAB — TYPE AND SCREEN
ABO/RH(D): O POS
Antibody Screen: NEGATIVE

## 2019-04-14 LAB — SARS CORONAVIRUS 2 BY RT PCR (HOSPITAL ORDER, PERFORMED IN ~~LOC~~ HOSPITAL LAB): SARS Coronavirus 2: NEGATIVE

## 2019-04-14 SURGERY — Surgical Case
Anesthesia: Spinal

## 2019-04-14 SURGICAL SUPPLY — 35 items
ADH SKN CLS APL DERMABOND .7 (GAUZE/BANDAGES/DRESSINGS) ×1
CANISTER SUCT 3000ML PPV (MISCELLANEOUS) ×4 IMPLANT
CLOSURE WOUND 1/2 X4 (GAUZE/BANDAGES/DRESSINGS) ×1
COVER WAND RF STERILE (DRAPES) ×4 IMPLANT
DERMABOND ADVANCED (GAUZE/BANDAGES/DRESSINGS) ×2
DERMABOND ADVANCED .7 DNX12 (GAUZE/BANDAGES/DRESSINGS) ×2 IMPLANT
DRSG TELFA 3X8 NADH (GAUZE/BANDAGES/DRESSINGS) ×3 IMPLANT
ELECT CAUTERY BLADE 6.4 (BLADE) ×4 IMPLANT
ELECT REM PT RETURN 9FT ADLT (ELECTROSURGICAL) ×3
ELECTRODE REM PT RTRN 9FT ADLT (ELECTROSURGICAL) ×2 IMPLANT
GAUZE SPONGE 4X4 12PLY STRL (GAUZE/BANDAGES/DRESSINGS) ×4 IMPLANT
GLOVE PI ORTHOPRO 6.5 (GLOVE) ×2
GLOVE PI ORTHOPRO STRL 6.5 (GLOVE) ×2 IMPLANT
GLOVE SURG SYN 6.5 ES PF (GLOVE) ×3 IMPLANT
GLOVE SURG SYN 6.5 PF PI (GLOVE) ×1 IMPLANT
GOWN STRL REUS W/ TWL LRG LVL3 (GOWN DISPOSABLE) ×6 IMPLANT
GOWN STRL REUS W/TWL LRG LVL3 (GOWN DISPOSABLE) ×9
NS IRRIG 1000ML POUR BTL (IV SOLUTION) ×4 IMPLANT
PACK C SECTION AR (MISCELLANEOUS) ×4 IMPLANT
PAD DRESSING TELFA 3X8 NADH (GAUZE/BANDAGES/DRESSINGS) ×1 IMPLANT
PAD OB MATERNITY 4.3X12.25 (PERSONAL CARE ITEMS) ×4 IMPLANT
PAD PREP 24X41 OB/GYN DISP (PERSONAL CARE ITEMS) ×4 IMPLANT
PENCIL SMOKE ULTRAEVAC 22 CON (MISCELLANEOUS) ×4 IMPLANT
STRAP SAFETY 5IN WIDE (MISCELLANEOUS) ×4 IMPLANT
STRIP CLOSURE SKIN 1/2X4 (GAUZE/BANDAGES/DRESSINGS) ×3 IMPLANT
SUT MNCRL 4-0 (SUTURE) ×3
SUT MNCRL 4-0 27XMFL (SUTURE) ×1
SUT PDS AB 1 TP1 96 (SUTURE) ×4 IMPLANT
SUT VIC AB 0 CT1 36 (SUTURE) ×8 IMPLANT
SUT VIC AB 2-0 CT1 27 (SUTURE) ×3
SUT VIC AB 2-0 CT1 TAPERPNT 27 (SUTURE) ×2 IMPLANT
SUT VIC AB 3-0 SH 27 (SUTURE) ×3
SUT VIC AB 3-0 SH 27X BRD (SUTURE) ×2 IMPLANT
SUTURE MNCRL 4-0 27XMF (SUTURE) ×2 IMPLANT
SWABSTK COMLB BENZOIN TINCTURE (MISCELLANEOUS) ×4 IMPLANT

## 2019-04-14 NOTE — Anesthesia Preprocedure Evaluation (Signed)
Anesthesia Evaluation  Patient identified by MRN, date of birth, ID band Patient awake    Reviewed: Allergy & Precautions, H&P , NPO status , Patient's Chart, lab work & pertinent test results, reviewed documented beta blocker date and time   History of Anesthesia Complications (+) Family history of anesthesia reaction and history of anesthetic complications  Airway Mallampati: II  TM Distance: >3 FB Neck ROM: full    Dental no notable dental hx.    Pulmonary neg pulmonary ROS,    Pulmonary exam normal breath sounds clear to auscultation       Cardiovascular Exercise Tolerance: Good negative cardio ROS   Rhythm:regular Rate:Normal     Neuro/Psych negative neurological ROS  negative psych ROS   GI/Hepatic negative GI ROS, Neg liver ROS,   Endo/Other  negative endocrine ROS  Renal/GU negative Renal ROS  negative genitourinary   Musculoskeletal negative musculoskeletal ROS (+)   Abdominal   Peds negative pediatric ROS (+)  Hematology negative hematology ROS (+)   Anesthesia Other Findings   Reproductive/Obstetrics (+) Pregnancy                             Anesthesia Physical  Anesthesia Plan  ASA: II  Anesthesia Plan: Spinal   Post-op Pain Management:    Induction:   PONV Risk Score and Plan:   Airway Management Planned: Nasal Cannula  Additional Equipment:   Intra-op Plan:   Post-operative Plan:   Informed Consent: I have reviewed the patients History and Physical, chart, labs and discussed the procedure including the risks, benefits and alternatives for the proposed anesthesia with the patient or authorized representative who has indicated his/her understanding and acceptance.       Plan Discussed with: CRNA  Anesthesia Plan Comments:         Anesthesia Quick Evaluation

## 2019-04-14 NOTE — Discharge Summary (Signed)
Patient ID: Kaitlyn Hamilton MRN: [id number] DOB/AGE: 14-Jul-1996 23 y.o.  Admit date: 04/13/2019 Discharge date: 04/14/2019  Admission Diagnoses: contractions  Discharge Diagnoses: false labor in third trimester  Prenatal Procedures: fetal and contraction monitoring  Consults: Anesthesia  Significant Diagnostic Studies:  Results for orders placed or performed during the hospital encounter of 04/13/19 (from the past 168 hour(s))  SARS Coronavirus 2 [facility] order, Performed in [place] hospital lab) Nasopharyngeal Nasopharyngeal Swab   Collection Time: 04/14/19 12:11 AM   Specimen: Nasopharyngeal Swab  Result Value Ref Range   SARS Coronavirus 2 NEGATIVE NEGATIVE  CBC   Collection Time: 04/14/19 12:50 AM  Result Value Ref Range   WBC 11.7 (H) 4.0 - 10.5 K/uL   RBC 3.83 (L) 3.87 - 5.11 MIL/uL   Hemoglobin 11.5 (L) 12.0 - 15.0 g/dL   HCT 33.8 (L) 36.0 - 46.0 %   MCV 88.3 80.0 - 100.0 fL   MCH 30.0 26.0 - 34.0 pg   MCHC 34.0 30.0 - 36.0 g/dL   RDW 13.0 11.5 - 15.5 %   Platelets 177 150 - 400 K/uL   nRBC 0.0 0.0 - 0.2 %  Type and screen [place]   Collection Time: 04/14/19 12:50 AM  Result Value Ref Range   ABO/RH(D) O POS    Antibody Screen NEG    Sample Expiration      04/17/2019,2359 Performed at [facility], [address] [place], [place] [address]     Treatments: IV hydration  Hospital Course:  This is a 23 y.o. G2P1001 with IUP at [redacted]w[redacted]d admitted for contractions, noted to have a cervical exam of FTP/long/high. No leaking of fluid and no bleeding. She was initially contracting every 5-6 minutes and reported that they were uncomfortable. The decision to move up her scheduled c-section was made. By the time her pre-op labs resulted, her contractions had spaced out and weakened in intensity. She was then kept for observation for the rest of the night. Her contractions continued to be mild and less frequent. Her cervix did not  change over a 10 hour period and was still FTP/long/high. She was observed, fetal heart rate monitoring remained reassuring, and she had no signs/symptoms of progressing labor or other maternal-fetal concerns. The decision was made for her to be discharged home and to continue with cesarean delivery as scheduled on Friday, August 28th. Return precautions reviewed in detail.   Discharge Physical Exam:  BP 124/82 (BP Location: Right Arm)   Pulse 87   Temp 97.9 F (36.6 C) (Oral)   Resp 16   Ht 5\' 3"  (1.6 m)   Wt 89.8 kg   LMP 07/20/2018   SpO2 100%   BMI 35.07 kg/m   General:   alert, cooperative and appears stated age  Skin:  normal and no rash or abnormalities  Neurologic:    Alert & oriented x 3  Lungs:   clear to auscultation bilaterally  Heart:   regular rate and rhythm, S1, S2 normal, no murmur, click, rub or gallop  Abdomen:  soft, non-tender; bowel sounds normal; no masses,  no organomegaly  Pelvis:  External genitalia: normal general appearance  FHT:  135 BPM  Cervix:    Dilation: FTP   Effacement: Long   Station:  Floating   Consistency: medium   Position: posterior  Extremities: : non-tender, symmetric, no edema bilaterally.   FHT: FHR: 135 bpm, variability: moderate,  accelerations:  Present,  decelerations:  Absent Category/reactivity:  Category  I TOCO: occasional to every 6-9 minutes  Discharge Condition: Stable  Disposition:    Allergies as of 04/14/2019      Reactions   Anesthesia S-i-40 [propofol] Nausea And Vomiting      Medication List    TAKE these medications   buPROPion 100 MG tablet Commonly known as: WELLBUTRIN Take 100 mg by mouth 2 (two) times daily.   nystatin-triamcinolone cream Commonly known as: MYCOLOG II Apply 1 application topically 2 (two) times daily as needed (irritation).   prenatal multivitamin Tabs tablet Take 1 tablet by mouth daily at 12 noon.   promethazine 25 MG tablet Commonly known as: PHENERGAN Take 25 mg by mouth  at bedtime.      Follow-up Information    [facility] Follow up.   Why: Return for scheduled c-section for 04/19/19 Contact information: [address] [place] [id number] [phone]          Signed:  Genia DelMargaret Aundria Bitterman 04/14/2019 9:12 AM ----- Genia DelMargaret Deshunda Thackston, CNM Certified Nurse Midwife [facility]

## 2019-04-15 ENCOUNTER — Other Ambulatory Visit: Payer: Self-pay

## 2019-04-15 ENCOUNTER — Encounter
Admission: RE | Admit: 2019-04-15 | Discharge: 2019-04-15 | Disposition: A | Discharge status: Home / Self Care | Payer: 59 | Source: Ambulatory Visit | Attending: Obstetrics & Gynecology | Admitting: Obstetrics & Gynecology

## 2019-04-15 DIAGNOSIS — Z20828 Contact with and (suspected) exposure to other viral communicable diseases: Secondary | ICD-10-CM | POA: Insufficient documentation

## 2019-04-15 DIAGNOSIS — Z01812 Encounter for preprocedural laboratory examination: Secondary | ICD-10-CM | POA: Diagnosis not present

## 2019-04-15 NOTE — Patient Instructions (Addendum)
Your procedure is scheduled on: Friday 04/19/19  Report to  [place] at 5:30 and call [phone] from the parking lot. Someone from Labor and Delivery or Security will come meet you and take you to Labor and Delivery.    Remember: Instructions that are not followed completely may result in serious medical risk, up to and including death, or upon the discretion of your surgeon and anesthesiologist your surgery may need to be rescheduled.      _X__ 1. Do not eat food after midnight the night before your procedure.                 No gum chewing or hard candies. You may drink clear liquids up to 2 hours                 before you are scheduled to arrive for your surgery- DO NOT drink clear                 liquids within 2 hours of the start of your surgery.                 Clear Liquids include:  water, apple juice without pulp, clear carbohydrate                 drink such as Clearfast or Gatorade, Black Coffee or Tea (Do not add                 Milk or creamer to coffee or tea).  **Please finish the Ensure Pre-Surgery drink by 4:30 on the morning of your procedure.    __X__2.  On the morning of surgery brush your teeth with toothpaste and water, you may rinse your mouth with mouthwash if you wish.  Do not swallow any toothpaste or mouthwash.       __X__3.  Notify your doctor if there is any change in your medical condition      (cold, fever, infections).     Do not wear jewelry, make-up, hairpins, clips or nail polish. Do not wear lotions, powders, or perfumes.  Do not shave 48 hours prior to surgery. Men may shave face and neck. Do not bring valuables to the hospital.    [facility] is not responsible for any belongings or valuables.  Contacts, dentures/partials or body piercings may not be worn into surgery. Bring a case for your contacts, glasses or hearing aids, a denture cup will be supplied.    Patients discharged the day of surgery will not be allowed to drive  home.     __X__ Take these medicines the morning of surgery with A SIP OF WATER:     1. buPROPion (WELLBUTRIN) 100 MG tablet      __X__ Use SAGE wipes as directed    __X__ Stop Anti-inflammatories 7 days before surgery such as Advil, Ibuprofen, Motrin, BC or Goodies Powder, Naprosyn, Naproxen, Aleve, Aspirin, Meloxicam. May take Tylenol if needed for pain or discomfort.

## 2019-04-16 ENCOUNTER — Other Ambulatory Visit: Payer: Self-pay

## 2019-04-16 ENCOUNTER — Other Ambulatory Visit
Admission: RE | Admit: 2019-04-16 | Discharge: 2019-04-16 | Disposition: A | Discharge status: Home / Self Care | Payer: 59 | Source: Ambulatory Visit | Attending: Obstetrics & Gynecology | Admitting: Obstetrics & Gynecology

## 2019-04-16 DIAGNOSIS — Z01812 Encounter for preprocedural laboratory examination: Secondary | ICD-10-CM | POA: Diagnosis not present

## 2019-04-16 LAB — SARS CORONAVIRUS 2 (TAT 6-24 HRS): SARS Coronavirus 2: NEGATIVE

## 2019-04-16 LAB — RPR: RPR Ser Ql: NONREACTIVE

## 2019-04-17 ENCOUNTER — Other Ambulatory Visit: Admission: RE | Admit: 2019-04-17 | Payer: No Typology Code available for payment source | Source: Ambulatory Visit

## 2019-04-19 ENCOUNTER — Inpatient Hospital Stay: Payer: 59 | Admitting: Certified Registered Nurse Anesthetist

## 2019-04-19 ENCOUNTER — Inpatient Hospital Stay
Admission: RE | Admit: 2019-04-19 | Discharge: 2019-04-21 | DRG: 787 | Disposition: A | Discharge status: Home / Self Care | Payer: 59 | Attending: Obstetrics & Gynecology | Admitting: Obstetrics & Gynecology

## 2019-04-19 ENCOUNTER — Other Ambulatory Visit: Payer: Self-pay

## 2019-04-19 ENCOUNTER — Encounter
Admission: RE | Disposition: A | Discharge status: Home / Self Care | Payer: Self-pay | Source: Home / Self Care | Attending: Obstetrics & Gynecology

## 2019-04-19 DIAGNOSIS — O34211 Maternal care for low transverse scar from previous cesarean delivery: Secondary | ICD-10-CM | POA: Diagnosis present

## 2019-04-19 DIAGNOSIS — O9081 Anemia of the puerperium: Secondary | ICD-10-CM | POA: Diagnosis not present

## 2019-04-19 DIAGNOSIS — O99344 Other mental disorders complicating childbirth: Secondary | ICD-10-CM | POA: Diagnosis present

## 2019-04-19 DIAGNOSIS — F329 Major depressive disorder, single episode, unspecified: Secondary | ICD-10-CM | POA: Diagnosis present

## 2019-04-19 DIAGNOSIS — D62 Acute posthemorrhagic anemia: Secondary | ICD-10-CM | POA: Diagnosis not present

## 2019-04-19 DIAGNOSIS — Z3A39 39 weeks gestation of pregnancy: Secondary | ICD-10-CM

## 2019-04-19 DIAGNOSIS — O99824 Streptococcus B carrier state complicating childbirth: Secondary | ICD-10-CM | POA: Diagnosis present

## 2019-04-19 LAB — TYPE AND SCREEN
ABO/RH(D): O POS
Antibody Screen: NEGATIVE

## 2019-04-19 LAB — CBC
HCT: 36 % (ref 36.0–46.0)
Hemoglobin: 12.2 g/dL (ref 12.0–15.0)
MCH: 30 pg (ref 26.0–34.0)
MCHC: 33.9 g/dL (ref 30.0–36.0)
MCV: 88.5 fL (ref 80.0–100.0)
Platelets: 179 10*3/uL (ref 150–400)
RBC: 4.07 MIL/uL (ref 3.87–5.11)
RDW: 12.9 % (ref 11.5–15.5)
WBC: 10.5 10*3/uL (ref 4.0–10.5)
nRBC: 0 % (ref 0.0–0.2)

## 2019-04-19 SURGERY — Surgical Case
Anesthesia: Spinal

## 2019-04-19 MED ORDER — ONDANSETRON HCL 4 MG/2ML IJ SOLN
INTRAMUSCULAR | Status: AC
  Filled 2019-04-19: qty 2

## 2019-04-19 MED ORDER — NALBUPHINE HCL 10 MG/ML IJ SOLN: 5.0000 mg | Freq: Once | INTRAMUSCULAR | Status: DC | PRN

## 2019-04-19 MED ORDER — SODIUM CHLORIDE 0.9% FLUSH: 3.0000 mL | Freq: Two times a day (BID) | INTRAVENOUS | Status: DC

## 2019-04-19 MED ORDER — BUPIVACAINE HCL 0.5 % IJ SOLN
INTRAMUSCULAR | Status: DC | PRN
  Administered 2019-04-19: 30 mL

## 2019-04-19 MED ORDER — DIPHENHYDRAMINE HCL 25 MG PO CAPS: 25.0000 mg | ORAL_CAPSULE | ORAL | Status: DC | PRN

## 2019-04-19 MED ORDER — ACETAMINOPHEN 500 MG PO TABS
1000.0000 mg | ORAL_TABLET | Freq: Four times a day (QID) | ORAL | Status: DC
  Administered 2019-04-20 – 2019-04-21 (×4): 1000 mg via ORAL
  Filled 2019-04-19 (×4): qty 2

## 2019-04-19 MED ORDER — MORPHINE SULFATE (PF) 0.5 MG/ML IJ SOLN
INTRAMUSCULAR | Status: DC | PRN
  Administered 2019-04-19: .1 mg via EPIDURAL

## 2019-04-19 MED ORDER — ACETAMINOPHEN 650 MG RE SUPP: 650.0000 mg | Freq: Once | RECTAL | Status: DC

## 2019-04-19 MED ORDER — KETOROLAC TROMETHAMINE 30 MG/ML IJ SOLN
30.0000 mg | Freq: Four times a day (QID) | INTRAMUSCULAR | Status: AC
  Administered 2019-04-19 – 2019-04-20 (×4): 30 mg via INTRAVENOUS
  Filled 2019-04-19 (×4): qty 1

## 2019-04-19 MED ORDER — MENTHOL 3 MG MT LOZG
1.0000 | LOZENGE | OROMUCOSAL | Status: DC | PRN
  Filled 2019-04-19: qty 9

## 2019-04-19 MED ORDER — MEPERIDINE HCL 25 MG/ML IJ SOLN: 6.2500 mg | INTRAMUSCULAR | Status: DC | PRN

## 2019-04-19 MED ORDER — SODIUM CHLORIDE 0.9 % IV SOLN
INTRAVENOUS | Status: DC | PRN
  Administered 2019-04-19: 40 ug/min via INTRAVENOUS

## 2019-04-19 MED ORDER — NALBUPHINE HCL 10 MG/ML IJ SOLN: 5.0000 mg | INTRAMUSCULAR | Status: DC | PRN

## 2019-04-19 MED ORDER — OXYCODONE HCL 5 MG PO TABS: 5.0000 mg | ORAL_TABLET | ORAL | Status: DC | PRN

## 2019-04-19 MED ORDER — OXYCODONE HCL 5 MG PO TABS
5.0000 mg | ORAL_TABLET | ORAL | Status: DC | PRN
  Administered 2019-04-20: 5 mg via ORAL
  Administered 2019-04-20: 10 mg via ORAL
  Administered 2019-04-20 (×2): 5 mg via ORAL
  Administered 2019-04-21 (×3): 10 mg via ORAL
  Filled 2019-04-19: qty 2
  Filled 2019-04-19 (×2): qty 1
  Filled 2019-04-19 (×2): qty 2
  Filled 2019-04-19: qty 1
  Filled 2019-04-19: qty 2

## 2019-04-19 MED ORDER — BUPIVACAINE LIPOSOME 1.3 % IJ SUSP: 20.0000 mL | Freq: Once | INTRAMUSCULAR | Status: DC

## 2019-04-19 MED ORDER — VARICELLA VIRUS VACCINE LIVE 1350 PFU/0.5ML IJ SUSR
0.5000 mL | INTRAMUSCULAR | Status: AC | PRN
  Administered 2019-04-21: 0.5 mL via SUBCUTANEOUS
  Filled 2019-04-19 (×2): qty 0.5

## 2019-04-19 MED ORDER — SODIUM CHLORIDE 0.9% FLUSH: 3.0000 mL | INTRAVENOUS | Status: DC | PRN

## 2019-04-19 MED ORDER — NALOXONE HCL 4 MG/10ML IJ SOLN
1.0000 ug/kg/h | INTRAVENOUS | Status: DC | PRN
  Filled 2019-04-19: qty 5

## 2019-04-19 MED ORDER — BUPIVACAINE LIPOSOME 1.3 % IJ SUSP
INTRAMUSCULAR | Status: AC
  Filled 2019-04-19: qty 20

## 2019-04-19 MED ORDER — KETOROLAC TROMETHAMINE 30 MG/ML IJ SOLN: 30.0000 mg | Freq: Four times a day (QID) | INTRAMUSCULAR | Status: AC

## 2019-04-19 MED ORDER — SODIUM CHLORIDE (PF) 0.9 % IJ SOLN
INTRAMUSCULAR | Status: AC
  Filled 2019-04-19: qty 50

## 2019-04-19 MED ORDER — SODIUM CHLORIDE 0.9 % IV SOLN: 250.0000 mL | INTRAVENOUS | Status: DC

## 2019-04-19 MED ORDER — ACETAMINOPHEN 325 MG PO TABS
650.0000 mg | ORAL_TABLET | Freq: Four times a day (QID) | ORAL | Status: AC
  Administered 2019-04-19 – 2019-04-20 (×4): 650 mg via ORAL
  Filled 2019-04-19 (×4): qty 2

## 2019-04-19 MED ORDER — WITCH HAZEL-GLYCERIN EX PADS: 1.0000 "application " | MEDICATED_PAD | CUTANEOUS | Status: DC | PRN

## 2019-04-19 MED ORDER — ONDANSETRON HCL 4 MG/2ML IJ SOLN
INTRAMUSCULAR | Status: DC | PRN
  Administered 2019-04-19: 4 mg via INTRAVENOUS

## 2019-04-19 MED ORDER — ONDANSETRON HCL 4 MG/2ML IJ SOLN: 4.0000 mg | Freq: Three times a day (TID) | INTRAMUSCULAR | Status: DC | PRN

## 2019-04-19 MED ORDER — FENTANYL CITRATE (PF) 100 MCG/2ML IJ SOLN
INTRAMUSCULAR | Status: DC | PRN
  Administered 2019-04-19: 15 ug via INTRAVENOUS

## 2019-04-19 MED ORDER — LACTATED RINGERS IV SOLN: INTRAVENOUS | Status: DC

## 2019-04-19 MED ORDER — LACTATED RINGERS IV SOLN: Freq: Once | INTRAVENOUS | Status: DC

## 2019-04-19 MED ORDER — PRENATAL MULTIVITAMIN CH
1.0000 | ORAL_TABLET | Freq: Every day | ORAL | Status: DC
  Administered 2019-04-19 – 2019-04-20 (×2): 1 via ORAL
  Filled 2019-04-19: qty 1

## 2019-04-19 MED ORDER — COCONUT OIL OIL: 1.0000 "application " | TOPICAL_OIL | Status: DC | PRN

## 2019-04-19 MED ORDER — BUPROPION HCL 100 MG PO TABS
100.0000 mg | ORAL_TABLET | Freq: Two times a day (BID) | ORAL | Status: DC
  Administered 2019-04-20 – 2019-04-21 (×3): 100 mg via ORAL
  Filled 2019-04-19 (×4): qty 1

## 2019-04-19 MED ORDER — LIDOCAINE HCL (PF) 1 % IJ SOLN
INTRAMUSCULAR | Status: DC | PRN
  Administered 2019-04-19: 3 mL via SUBCUTANEOUS

## 2019-04-19 MED ORDER — OXYCODONE HCL 5 MG PO TABS: 10.0000 mg | ORAL_TABLET | ORAL | Status: DC | PRN

## 2019-04-19 MED ORDER — IBUPROFEN 600 MG PO TABS
600.0000 mg | ORAL_TABLET | Freq: Four times a day (QID) | ORAL | Status: DC
  Administered 2019-04-20 – 2019-04-21 (×5): 600 mg via ORAL
  Filled 2019-04-19 (×5): qty 1

## 2019-04-19 MED ORDER — DIPHENHYDRAMINE HCL 50 MG/ML IJ SOLN: 12.5000 mg | INTRAMUSCULAR | Status: DC | PRN

## 2019-04-19 MED ORDER — MORPHINE SULFATE (PF) 0.5 MG/ML IJ SOLN
INTRAMUSCULAR | Status: AC
  Filled 2019-04-19: qty 10

## 2019-04-19 MED ORDER — OXYTOCIN 40 UNITS IN NORMAL SALINE INFUSION - SIMPLE MED
INTRAVENOUS | Status: DC | PRN
  Administered 2019-04-19: 500 mL via INTRAVENOUS

## 2019-04-19 MED ORDER — OXYTOCIN 40 UNITS IN NORMAL SALINE INFUSION - SIMPLE MED
INTRAVENOUS | Status: AC
  Filled 2019-04-19: qty 1000

## 2019-04-19 MED ORDER — OXYTOCIN 40 UNITS IN NORMAL SALINE INFUSION - SIMPLE MED
2.5000 [IU]/h | INTRAVENOUS | Status: AC
  Administered 2019-04-19: 18:00:00 2.5 [IU]/h via INTRAVENOUS
  Filled 2019-04-19: qty 1000

## 2019-04-19 MED ORDER — SIMETHICONE 80 MG PO CHEW: 160.0000 mg | CHEWABLE_TABLET | Freq: Four times a day (QID) | ORAL | Status: DC | PRN

## 2019-04-19 MED ORDER — FENTANYL CITRATE (PF) 100 MCG/2ML IJ SOLN
INTRAMUSCULAR | Status: AC
  Filled 2019-04-19: qty 2

## 2019-04-19 MED ORDER — NALOXONE HCL 0.4 MG/ML IJ SOLN: 0.4000 mg | INTRAMUSCULAR | Status: DC | PRN

## 2019-04-19 MED ORDER — BUPIVACAINE HCL (PF) 0.5 % IJ SOLN
INTRAMUSCULAR | Status: AC
  Filled 2019-04-19: qty 30

## 2019-04-19 MED ORDER — BUPIVACAINE HCL (PF) 0.5 % IJ SOLN: 30.0000 mL | Freq: Once | INTRAMUSCULAR | Status: DC

## 2019-04-19 MED ORDER — SENNOSIDES-DOCUSATE SODIUM 8.6-50 MG PO TABS
2.0000 | ORAL_TABLET | ORAL | Status: DC
  Administered 2019-04-20 (×2): 2 via ORAL
  Filled 2019-04-19 (×2): qty 2

## 2019-04-19 MED ORDER — ONDANSETRON HCL 4 MG/2ML IJ SOLN
4.0000 mg | Freq: Once | INTRAMUSCULAR | Status: AC
  Administered 2019-04-19: 07:00:00 4 mg via INTRAVENOUS

## 2019-04-19 MED ORDER — LACTATED RINGERS IV SOLN
INTRAVENOUS | Status: DC
  Administered 2019-04-19: 07:00:00 via INTRAVENOUS

## 2019-04-19 MED ORDER — DIBUCAINE (PERIANAL) 1 % EX OINT: 1.0000 "application " | TOPICAL_OINTMENT | CUTANEOUS | Status: DC | PRN

## 2019-04-19 MED ORDER — SEVOFLURANE IN SOLN
RESPIRATORY_TRACT | Status: AC
  Filled 2019-04-19: qty 250

## 2019-04-19 MED ORDER — CEFAZOLIN SODIUM-DEXTROSE 2-4 GM/100ML-% IV SOLN
2.0000 g | Freq: Three times a day (TID) | INTRAVENOUS | Status: DC
  Administered 2019-04-19: 08:00:00 2 g via INTRAVENOUS
  Filled 2019-04-19 (×3): qty 100

## 2019-04-19 MED ORDER — BUPIVACAINE IN DEXTROSE 0.75-8.25 % IT SOLN
INTRATHECAL | Status: DC | PRN
  Administered 2019-04-19: 1.6 mL via INTRATHECAL

## 2019-04-19 MED ORDER — DIPHENHYDRAMINE HCL 25 MG PO CAPS: 25.0000 mg | ORAL_CAPSULE | Freq: Four times a day (QID) | ORAL | Status: DC | PRN

## 2019-04-19 MED ORDER — SODIUM CHLORIDE 0.9 % IV SOLN
INTRAVENOUS | Status: DC | PRN
  Administered 2019-04-19: 09:00:00 70 mL

## 2019-04-19 SURGICAL SUPPLY — 37 items
ADH SKN CLS APL DERMABOND .7 (GAUZE/BANDAGES/DRESSINGS) ×1
BARRIER ADHS 3X4 INTERCEED (GAUZE/BANDAGES/DRESSINGS) ×2 IMPLANT
BRR ADH 4X3 ABS CNTRL BYND (GAUZE/BANDAGES/DRESSINGS) ×1
CANISTER SUCT 3000ML PPV (MISCELLANEOUS) ×3 IMPLANT
CLOSURE WOUND 1/2 X4 (GAUZE/BANDAGES/DRESSINGS) ×1
COVER WAND RF STERILE (DRAPES) ×3 IMPLANT
DERMABOND ADVANCED (GAUZE/BANDAGES/DRESSINGS) ×2
DERMABOND ADVANCED .7 DNX12 (GAUZE/BANDAGES/DRESSINGS) ×1 IMPLANT
DRSG TELFA 3X8 NADH (GAUZE/BANDAGES/DRESSINGS) ×3 IMPLANT
ELECT CAUTERY BLADE 6.4 (BLADE) ×1 IMPLANT
ELECT REM PT RETURN 9FT ADLT (ELECTROSURGICAL) ×3
ELECTRODE REM PT RTRN 9FT ADLT (ELECTROSURGICAL) ×1 IMPLANT
GAUZE SPONGE 4X4 12PLY STRL (GAUZE/BANDAGES/DRESSINGS) ×3 IMPLANT
GLOVE PI ORTHOPRO 6.5 (GLOVE) ×2
GLOVE PI ORTHOPRO STRL 6.5 (GLOVE) ×1 IMPLANT
GLOVE SURG SYN 6.5 ES PF (GLOVE) ×3 IMPLANT
GLOVE SURG SYN 6.5 PF PI (GLOVE) ×1 IMPLANT
GOWN STRL REUS W/ TWL LRG LVL3 (GOWN DISPOSABLE) ×3 IMPLANT
GOWN STRL REUS W/TWL LRG LVL3 (GOWN DISPOSABLE) ×9
NS IRRIG 1000ML POUR BTL (IV SOLUTION) ×3 IMPLANT
PACK C SECTION AR (MISCELLANEOUS) ×3 IMPLANT
PAD DRESSING TELFA 3X8 NADH (GAUZE/BANDAGES/DRESSINGS) ×1 IMPLANT
PAD OB MATERNITY 4.3X12.25 (PERSONAL CARE ITEMS) ×3 IMPLANT
PAD PREP 24X41 OB/GYN DISP (PERSONAL CARE ITEMS) ×3 IMPLANT
PENCIL SMOKE ULTRAEVAC 22 CON (MISCELLANEOUS) ×3 IMPLANT
STRAP SAFETY 5IN WIDE (MISCELLANEOUS) ×3 IMPLANT
STRIP CLOSURE SKIN 1/2X4 (GAUZE/BANDAGES/DRESSINGS) ×2 IMPLANT
SUT MNCRL 4-0 (SUTURE) ×3
SUT MNCRL 4-0 27XMFL (SUTURE) ×1
SUT PDS AB 1 TP1 96 (SUTURE) ×3 IMPLANT
SUT VIC AB 0 CT1 36 (SUTURE) ×6 IMPLANT
SUT VIC AB 2-0 CT1 27 (SUTURE) ×3
SUT VIC AB 2-0 CT1 TAPERPNT 27 (SUTURE) ×1 IMPLANT
SUT VIC AB 3-0 SH 27 (SUTURE) ×3
SUT VIC AB 3-0 SH 27X BRD (SUTURE) ×1 IMPLANT
SUTURE MNCRL 4-0 27XMF (SUTURE) ×1 IMPLANT
SWABSTK COMLB BENZOIN TINCTURE (MISCELLANEOUS) ×3 IMPLANT

## 2019-04-19 NOTE — Op Note (Signed)
Cesarean Section Procedure Note  04/19/2019   Patient:  Kaitlyn Hamilton  23 y.o. female at [redacted]w[redacted]d.  Patient's last menstrual period was 07/20/2018. Preoperative diagnosis:  prior c-section, term IUP Postoperative diagnosis:  prior c-section, Term IUP, live born female  PROCEDURE:  Procedure(s): CESAREAN SECTION, REPEAT (N/A) Surgeon:  Surgeon(s) and Role:    * , Honor Loh, MD - Primary Hassan Buckler - Assisting Anesthesia:  spinal I/O: see flowsheet, 350c QBL Specimens:  Cord Blood Complications: None Apparent Disposition:  VS stable to PACU  Findings: normal uterus, tubes and ovaries bilaterally Live born female  Birth Weight: 7 lb 2.6 oz (3250 g) APGAR: 9, 9  Newborn Delivery   Birth date/time: 04/19/2019 08:34:00 Delivery type: C-Section, Low Transverse Trial of labor: No C-section categorization: Repeat       Indication for procedure: 23 y.o. female at [redacted]w[redacted]d with previous cesarean who requested repeat at term.  Procedure Details   The risks, benefits, complications, treatment options, and expected outcomes were discussed with the patient. Informed consent was obtained. The patient was taken to Operating Room, identified as Kaitlyn Hamilton and the procedure verified as a cesarean delivery.   After administration of anesthesia, the patient was prepped and draped in the usual sterile manner, including a vaginal prep. A surgical time out was performed, with the pediatric team present. After confirming adequate anesthesia, a Pfannenstiel incision was made and carried down through the subcutaneous tissue to the fascia. Fascial incision was made and extended transversely. The fascia was separated from the underlying rectus tissue superiorly and inferiorly. The rectus muscles were divided in the midline. The peritoneum was identified and entered. Peritoneal incision was extended longitudinally.  A low transverse uterine incision was made. Delivered from cephalic presentation  was a live born female . Delayed cord clamping was performed for 60 seconds during which time we sang happy birthday to [place]. The umbilical cord was doubly clamped and cut, and the baby was handed off to the awaitng pediatrician.  Cord blood was obtained for evaluation. The placenta was removed intact and appeared normal. The uterus was delivered from the abdominal cavity and cleared of clots, membranes, and debris. The uterus, tubes and ovaries appeared normal. The uterine incision was closed with running locking sutures of 0 Vicryl, and then a second, imbricating stitch was placed. Hemostasis was observed. The abdominal cavity was evacuated of extraneous fluid. The uterus was returned to the abdominal cavity and again the incision was inspected for hemostasis, which was confirmed.  The paracolic gutters were cleaned.  The fascia was then reapproximated with running suture of vicryl. 90cc of Long- and short-acting bupivicaine was injected circumferentially into the fascia.  After a change of gloves, the subcutaneous tissue was irrigated and reapproximated with 3-0 vicryl. The skin was closed with 4-0 Monocryl and 10cc of long- and short-acting bupivacaine injected into the skin and subcutaneous tissues.  The incision was covered with surgical glue.     Instrument, sponge, and needle counts were correct prior the abdominal closure and at the conclusion of the case.   I was present and performed this procedure in its entirety.  VTE: SCDs Perioperative antibiotics: Ancef 2g  ----- Larey Days, MD Attending Obstetrician and Gynecologist [facility], Department of [facility]

## 2019-04-19 NOTE — Anesthesia Procedure Notes (Addendum)
Spinal  Patient location during procedure: OR Start time: 04/19/2019 7:57 AM End time: 04/19/2019 8:06 AM Staffing Anesthesiologist: Emmie Niemann, MD Resident/CRNA: Eben Burow, CRNA Performed: resident/CRNA  Preanesthetic Checklist Completed: patient identified, site marked, surgical consent, pre-op evaluation, timeout performed, IV checked, risks and benefits discussed and monitors and equipment checked Spinal Block Patient position: sitting Prep: ChloraPrep and site prepped and draped Patient monitoring: heart rate, continuous pulse ox and blood pressure Approach: midline Location: L3-4 Injection technique: single-shot Needle Needle type: Introducer and Pencan  Needle gauge: 24 G Needle length: 9 cm Assessment Sensory level: T4

## 2019-04-19 NOTE — Discharge Summary (Signed)
duplicate

## 2019-04-19 NOTE — Transfer of Care (Signed)
Immediate Anesthesia Transfer of Care Note  Patient: Kaitlyn Hamilton  Procedure(s) Performed: CESAREAN SECTION, REPEAT (N/A )  Patient Location: Mother/Baby  Anesthesia Type:Spinal  Level of Consciousness: awake, alert , oriented and patient cooperative  Airway & Oxygen Therapy: Patient Spontanous Breathing  Post-op Assessment: Report given to RN and Post -op Vital signs reviewed and stable  Post vital signs: Reviewed and stable  Last Vitals:  Vitals Value Taken Time  BP 102/48 04/19/19 0943  Temp 98.2   Pulse 82 04/19/19 0943  Resp 18   SpO2 100% 04/19/19 0943  Vitals shown include unvalidated device data.  Last Pain:  Vitals:   04/19/19 0738  TempSrc: Oral  PainSc:          Complications: No apparent anesthesia complications

## 2019-04-19 NOTE — Lactation Note (Addendum)
This note was copied from a baby's chart. Lactation Consultation Note  Patient Name: Kaitlyn Hamilton [id number] Date: 04/19/2019 Reason for consult: Follow-up assessment;Term  Assisted mom in comfortable position in cradle hold skin to skin trying to keep him off her incision after C/S.  Attempted in football hold, but neither mom or [name] seemed comfortable.  Initially [name] would not open his mouth wide enough and was sucking in lower lip.  Once we got his lower lip flanged by gently lowering chin, he latched with minimal assistance and began strong rhythmic sucking with occasional swallows.  Mom had tendency to let him slip to tip of nipple causing shallow latch.  Encouraged to keep him close for deeper latch.  Reviewed feeding cues and encouraged mom to put him to the breast whenever he demonstrated hunger cues trying to breast feed 8 or more times in 24 hours.  Mom breast fed 71 year old for 2 months, but in that 2 months he kept getting thrush and she kept getting mastitis.  She started pumping at 2 months and pumped exclusively up to 10 months, pumping enough for him to have only breast milk until he was over 44 year old.  Discussed how to prevent mastitis and thrush this time with breast feeding.  Reminded mom what to expect the first few days with newborn stomach size, supply and demand, normal course of lactation and routine newborn feeding patterns.  Lactation name and number written on white board and encouraged to call with any questions, concerns or assistance. Maternal Data Formula Feeding for Exclusion: No Has patient been taught Hand Expression?: Yes Does the patient have breastfeeding experience prior to this delivery?: Yes  Feeding Feeding Type: Breast Fed  LATCH Score Latch: Repeated attempts needed to sustain latch, nipple held in mouth throughout feeding, stimulation needed to elicit sucking reflex.  Audible Swallowing: A few with stimulation  Type of Nipple: Everted at rest  and after stimulation  Comfort (Breast/Nipple): Soft / non-tender  Hold (Positioning): Assistance needed to correctly position infant at breast and maintain latch.  LATCH Score: 7  Interventions Interventions: Breast feeding basics reviewed;Assisted with latch;Skin to skin;Breast massage;Hand express;Breast compression;Adjust position;Support pillows;Position options  Lactation Tools Discussed/Used WIC Program: [employer])   Consult Status Consult Status: PRN Follow-up type: Call as needed    Jarold Motto 04/19/2019, 4:15 PM

## 2019-04-19 NOTE — Anesthesia Preprocedure Evaluation (Signed)
Anesthesia Evaluation  Patient identified by MRN, date of birth, ID band Patient awake    Reviewed: Allergy & Precautions, NPO status , Patient's Chart, lab work & pertinent test results  History of Anesthesia Complications Negative for: history of anesthetic complications  Airway Mallampati: II  TM Distance: >3 FB Neck ROM: Full    Dental no notable dental hx.    Pulmonary neg pulmonary ROS, neg sleep apnea, neg COPD,    breath sounds clear to auscultation- rhonchi (-) wheezing      Cardiovascular Exercise Tolerance: Good (-) hypertension(-) CAD and (-) Past MI  Rhythm:Regular Rate:Normal - Systolic murmurs and - Diastolic murmurs    Neuro/Psych negative neurological ROS  negative psych ROS   GI/Hepatic negative GI ROS, Neg liver ROS,   Endo/Other  negative endocrine ROSneg diabetes  Renal/GU negative Renal ROS     Musculoskeletal negative musculoskeletal ROS (+)   Abdominal (+) + obese, Gravid abdomen  Peds  Hematology negative hematology ROS (+)   Anesthesia Other Findings Past Medical History: No date: Complication of anesthesia     Comment:  nausea and vomiting for 3 days after delivery No date: Family history of adverse reaction to anesthesia     Comment:  mom - PONV No date: Hypertrophy of tonsils and adenoids   Reproductive/Obstetrics (+) Pregnancy                             Lab Results  Component Value Date   WBC 11.7 (H) 04/14/2019   HGB 11.5 (L) 04/14/2019   HCT 33.8 (L) 04/14/2019   MCV 88.3 04/14/2019   PLT 177 04/14/2019    Anesthesia Physical Anesthesia Plan  ASA: II  Anesthesia Plan: Spinal   Post-op Pain Management:    Induction:   PONV Risk Score and Plan: 2 and Ondansetron  Airway Management Planned: Natural Airway  Additional Equipment:   Intra-op Plan:   Post-operative Plan:   Informed Consent: I have reviewed the patients History and  Physical, chart, labs and discussed the procedure including the risks, benefits and alternatives for the proposed anesthesia with the patient or authorized representative who has indicated his/her understanding and acceptance.     Dental advisory given  Plan Discussed with: CRNA and Anesthesiologist  Anesthesia Plan Comments:         Anesthesia Quick Evaluation

## 2019-04-19 NOTE — Anesthesia Post-op Follow-up Note (Signed)
Anesthesia QCDR form completed.        

## 2019-04-20 LAB — CBC
HCT: 30.8 % — ABNORMAL LOW (ref 36.0–46.0)
Hemoglobin: 10.3 g/dL — ABNORMAL LOW (ref 12.0–15.0)
MCH: 30.1 pg (ref 26.0–34.0)
MCHC: 33.4 g/dL (ref 30.0–36.0)
MCV: 90.1 fL (ref 80.0–100.0)
Platelets: 155 10*3/uL (ref 150–400)
RBC: 3.42 MIL/uL — ABNORMAL LOW (ref 3.87–5.11)
RDW: 13.1 % (ref 11.5–15.5)
WBC: 13.1 10*3/uL — ABNORMAL HIGH (ref 4.0–10.5)
nRBC: 0 % (ref 0.0–0.2)

## 2019-04-20 MED ORDER — SERTRALINE HCL 25 MG PO TABS
25.0000 mg | ORAL_TABLET | Freq: Every day | ORAL | Status: DC
  Administered 2019-04-20: 21:00:00 25 mg via ORAL
  Filled 2019-04-20: qty 1

## 2019-04-20 NOTE — Progress Notes (Addendum)
Pt completed Edinburgh scale, scored 11. Answered sometimes to question regarding self-harm. CNM notified, enroute to bedside. SW consult placed.

## 2019-04-20 NOTE — Progress Notes (Addendum)
Post Partum Day 1 Subjective: Doing well, no complaints.  Tolerating regular diet, pain with PO meds, voiding and ambulating without difficulty. Has not ambulated in hallway yet.   No CP SOB Fever,Chills, N/V or leg pain; denies nipple or breast pain; no HA change of vision, RUQ/epigastric pain  Objective: BP 110/78 (BP Location: Left Arm)   Pulse 99   Temp 98.3 F (36.8 C) (Oral)   Resp 20   Ht 5\' 3"  (1.6 m)   Wt 90.7 kg   LMP 07/20/2018   SpO2 99%   BMI 35.43 kg/m    Physical Exam:  General: NAD Breasts: soft/nontender CV: RRR Pulm: nl effort, CTABL Abdomen: soft, NT, BS x 4 Incision: No erythema or drainage Lochia: small Uterine Fundus: fundus firm and 1 fb below umbilicus DVT Evaluation: no cords, ttp LEs   Recent Labs    04/19/19 0714 04/20/19 0718  HGB 12.2 10.3*  HCT 36.0 30.8*  WBC 10.5 13.1*  PLT 179 155    Assessment/Plan: 23 y.o. G2P2002 postpartum day # 1  - Continue routine PP care - Lactation consult prn.  - Discussed contraceptive options, undecided. - Acute blood loss anemia - hemodynamically stable and asymptomatic; start po ferrous sulfate BID with stool softeners  - Immunization status: needs varicella prior to DC    Disposition: Does not desire Dc home today.     Francetta Found, CNM 04/20/2019  11:12 AM   Addendum: Notified by nursing about Positive EPDS with score 2 on #10. Pt contracts for safety and reports significant anxiety about baby blues starting. 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 She is currently taking welbutrin, but feels additional therapy might help with anxiety component.  Francetta Found, CNM 04/20/2019 6:54 PM

## 2019-04-21 LAB — RPR: RPR Ser Ql: NONREACTIVE

## 2019-04-21 MED ORDER — OXYCODONE HCL 5 MG PO TABS: 5.0000 mg | ORAL_TABLET | ORAL | 0 refills | Status: DC | PRN

## 2019-04-21 MED ORDER — ACETAMINOPHEN 500 MG PO TABS: 1000.0000 mg | ORAL_TABLET | Freq: Four times a day (QID) | ORAL | 0 refills | Status: DC

## 2019-04-21 MED ORDER — SENNOSIDES-DOCUSATE SODIUM 8.6-50 MG PO TABS: 2.0000 | ORAL_TABLET | ORAL | 0 refills | Status: DC

## 2019-04-21 MED ORDER — SIMETHICONE 80 MG PO CHEW: 160.0000 mg | CHEWABLE_TABLET | Freq: Four times a day (QID) | ORAL | 0 refills | Status: DC | PRN

## 2019-04-21 MED ORDER — SERTRALINE HCL 25 MG PO TABS: 25.0000 mg | ORAL_TABLET | Freq: Every day | ORAL | 0 refills | Status: DC

## 2019-04-21 MED ORDER — IBUPROFEN 600 MG PO TABS: 600.0000 mg | ORAL_TABLET | Freq: Four times a day (QID) | ORAL | 0 refills | Status: DC

## 2019-04-21 NOTE — Anesthesia Postprocedure Evaluation (Signed)
Anesthesia Post Note  Patient: Kaitlyn Hamilton  Procedure(s) Performed: CESAREAN SECTION, REPEAT (N/A )  Patient location during evaluation: Mother Baby Anesthesia Type: Spinal Level of consciousness: oriented and awake and alert Pain management: pain level controlled Respiratory status: spontaneous breathing and respiratory function stable Cardiovascular status: blood pressure returned to baseline and stable Postop Assessment: no headache, no backache, no apparent nausea or vomiting and able to ambulate Anesthetic complications: no     Last Vitals:  Vitals:   04/20/19 2237 04/21/19 0800  BP: 120/86 118/80  Pulse: 95 94  Resp: 18 18  Temp: 36.8 C 36.9 C  SpO2: 98% 100%    Last Pain:  Vitals:   04/21/19 0800  TempSrc: Oral  PainSc:                  Durenda Hurt

## 2019-04-21 NOTE — Progress Notes (Signed)
Reviewed D/C instructions with pt and family. Pt verbalized understanding of teaching. Discharged to home via W/C. Pt to schedule f/u appt.  

## 2019-04-21 NOTE — Progress Notes (Signed)
Post Partum Day 2 Subjective: Doing well, no complaints.  Tolerating regular diet, pain with PO meds, voiding and ambulating without difficulty. Has been ambulating and feels well overall, still somewhat anxious about baby blues vs depression due to prior hx. Plans to continue welbutrin and have added Zoloft as adjunct therapy. She is feeling well, though slightly nauseated this morning.   No CP SOB Fever,Chills, N/V or leg pain; denies nipple or breast pain; no HA change of vision, RUQ/epigastric pain  Objective: BP 118/80 (BP Location: Left Arm)   Pulse 94   Temp 98.4 F (36.9 C) (Oral)   Resp 18   Ht 5\' 3"  (1.6 m)   Wt 90.7 kg   LMP 07/20/2018   SpO2 100%   BMI 35.43 kg/m    Physical Exam:  General: NAD Breasts: soft/nontender CV: RRR Pulm: nl effort, CTABL Abdomen: soft, NT, BS x 4 Incision: No erythema or drainage Lochia: small Uterine Fundus: fundus firm and 1 fb below umbilicus DVT Evaluation: no cords, ttp LEs   Recent Labs    04/19/19 0714 04/20/19 0718  HGB 12.2 10.3*  HCT 36.0 30.8*  WBC 10.5 13.1*  PLT 179 155    Assessment/Plan: 23 y.o. G2P2002 postpartum day # 2  - Continue routine PP care - Lactation consult prn.  - Discussed contraceptive options, undecided. - PP depression warning signs and sx discussed, encouraged counseling, reviewed self care.   - Immunization status: needs varicella prior to DC    Disposition: Does desire Dc home today.     Francetta Found, CNM 04/21/2019  11:17 AM

## 2019-04-21 NOTE — Progress Notes (Signed)
CSW received consult due to score 11 on Edinburgh Depression Screen.   CSW spoke with MOB via phone due to working remotely at [employer] voiced no current concerns at this time.  MOB currently lives with her spouse and 23 year old and voiced he is a primary support for her. MOB's family lives in the area and are very involved. MOB stated she had baby blues with her first child and request medication to be proactive. CSW encouraged MOB to monitor her emotions and how the medication made her feel over the next few weeks and reach out to her supports if she needed to.   CSW provided education regarding Baby Blues vs PMADs and provided MOB with resources for mental health follow up.  CSW encouraged MOB to evaluate her mental health throughout the postpartum period with the use of the New Mom Checklist developed by Postpartum Progress as well as the [place] Postnatal Depression Scale and notify a medical professional if symptoms arise.    No other concerns voiced by MOB.   Kingsley Spittle, LCSW [facility]  [phone]

## 2019-04-21 NOTE — Discharge Summary (Signed)
Obstetrical Discharge Summary  Patient Name: Kaitlyn Hamilton DOB: August 11, 1996 MRN: [id number]  Date of Admission: 04/19/2019 Date of Delivery: 04/19/19 Delivered by: Ranae Plumberhelsea Ward MD Date of Discharge: 04/21/2019  [facility]  WUJ:[id number]:Patient's last menstrual period was 07/20/2018. EDC Estimated Date of Delivery: 04/26/19 Gestational Age at Delivery: 4548w2d   Antepartum complications:  Admitting Diagnosis:  Secondary Diagnosis: Patient Active Problem List   Diagnosis Date Noted  . Labor and delivery indication for care or intervention 04/13/2019    Augmentation: n/a Complications: None Intrapartum complications/course: planned CS- see Op note Date of Delivery: 04/19/19 Delivered By: Ranae Plumberhelsea Ward MD Delivery Type: repeat cesarean section, low transverse incision Anesthesia: epidural Placenta: expressed Laceration: none Episiotomy: none Newborn Data: Live born female "Leonor LivHolt" Birth Weight: 7 lb 2.6 oz (3250 g) APGAR: 9, 9  Newborn Delivery   Birth date/time: 04/19/2019 08:34:00 Delivery type: C-Section, Low Transverse Trial of labor: No C-section categorization: Repeat        Postpartum Procedures: none  Post partum course: Cesarean Section:  Patient had an uncomplicated postpartum course.  By time of discharge on POD#2, her pain was controlled on oral pain medications; she had appropriate lochia and was ambulating, voiding without difficulty, tolerating regular diet and passing flatus.   She was deemed stable for discharge to home.    Discharge Physical Exam:  BP 118/80 (BP Location: Left Arm)   Pulse 94   Temp 98.4 F (36.9 C) (Oral)   Resp 18   Ht 5\' 3"  (1.6 m)   Wt 90.7 kg   LMP 07/20/2018   SpO2 100%   BMI 35.43 kg/m   General: NAD CV: RRR Pulm: CTABL, nl effort ABD: s/nd/nt, fundus firm and below the umbilicus Lochia: small Incision: c/d/i, healing well, no significant drainage, no dehiscence, no significant erythema DVT Evaluation:  LE non-ttp, no evidence of DVT on exam.  Hemoglobin  Date Value Ref Range Status  04/20/2019 10.3 (L) 12.0 - 15.0 g/dL Final   HGB  Date Value Ref Range Status  08/30/2012 13.4 12.0 - 16.0 g/dL Final   HCT  Date Value Ref Range Status  04/20/2019 30.8 (L) 36.0 - 46.0 % Final  08/31/2012 32.2 (L) 35.0 - 47.0 % Final     Disposition: stable, discharge to home. Baby Feeding: breastmilk Baby Disposition: home with mom   Rh Immune globulin given: n/a Rubella vaccine given: immune Varicella vaccine given: non-immune, needs prior to DC Tdap vaccine given in AP or PP setting: 02/06/19 Flu vaccine given in AP or PP setting: n/a  Contraception: pills  Prenatal Labs:  Blood type/Rh  O pos  Antibody screen neg  Rubella Immune  Varicella  NON-Immune  RPR NR  HBsAg Neg  HIV NR  GC neg  Chlamydia neg  Genetic screening negative  1 hour GTT  164  3 hour GTT  [phone]  GBS  Pos     Plan:  Kaitlyn Resideslexandra M Vanderford was discharged to home in good condition. Follow-up appointment with delivering provider in 6 weeks.  Discharge Medications: Allergies as of 04/21/2019      Reactions   Anesthesia S-i-40 [propofol] Nausea And Vomiting      Medication List    STOP taking these medications   promethazine 25 MG tablet Commonly known as: PHENERGAN     TAKE these medications   acetaminophen 500 MG tablet Commonly known as: TYLENOL Take 2 tablets (1,000 mg total) by mouth every 6 (six) hours.   buPROPion 100 MG tablet  Commonly known as: WELLBUTRIN Take 100 mg by mouth 2 (two) times daily.   ibuprofen 600 MG tablet Commonly known as: ADVIL Take 1 tablet (600 mg total) by mouth every 6 (six) hours.   nystatin-triamcinolone cream Commonly known as: MYCOLOG II Apply 1 application topically 2 (two) times daily as needed (irritation).   oxyCODONE 5 MG immediate release tablet Commonly known as: Oxy IR/ROXICODONE Take 1 tablet (5 mg total) by mouth every 4 (four) hours as  needed for moderate pain.   prenatal multivitamin Tabs tablet Take 1 tablet by mouth daily at 12 noon.   senna-docusate 8.6-50 MG tablet Commonly known as: Senokot-S Take 2 tablets by mouth daily. Start taking on: April 22, 2019   sertraline 25 MG tablet Commonly known as: ZOLOFT Take 1 tablet (25 mg total) by mouth at bedtime.   simethicone 80 MG chewable tablet Commonly known as: MYLICON Chew 2 tablets (160 mg total) by mouth 4 (four) times daily as needed for flatulence.       Follow-up Information    Ward, Honor Loh, MD Follow up in 2 week(s).   Specialty: Obstetrics and Gynecology Why: 2 week post-op video visit and depression check Contact information: [place] [id number] [phone]           Signed:  Francetta Found, CNM 04/21/2019  11:18 AM

## 2019-04-24 NOTE — H&P (Addendum)
OB History & Physical   History of Present Illness:  Chief Complaint:   HPI:  Kaitlyn Hamilton is a 23 y.o. G32P1001 female at [redacted]w[redacted]d dated by LMP. Patient's last menstrual period was 07/20/2018. Estimated Date of Delivery: 04/26/19  She presents to L&D for scheduled repeat cesarean  +FM, occ CTX, no LOF, no VB  Pregnancy Issues: 1. Depression, on wellbutrin 2. Prior cesarean, currently pregnant 3. +GBS 4. varicalla non-immunie   Maternal Medical History:   Past Medical History:  Diagnosis Date  . Complication of anesthesia    nausea and vomiting for 3 days after delivery  . Family history of adverse reaction to anesthesia    mom - PONV  . Hypertrophy of tonsils and adenoids     Past Surgical History:  Procedure Laterality Date  . CESAREAN SECTION    . CESAREAN SECTION N/A 04/19/2019   Procedure: CESAREAN SECTION, REPEAT;  Surgeon: Eric Morganti, Honor Loh, MD;  Location: [facility];  Service: Obstetrics;  Laterality: N/A;  . TONSILLECTOMY AND ADENOIDECTOMY N/A 05/06/2016   Procedure: TONSILLECTOMY AND ADENOIDECTOMY;  Surgeon: Beverly Gust, MD;  Location: [place];  Service: ENT;  Laterality: N/A;  . WISDOM TOOTH EXTRACTION      Allergies  Allergen Reactions  . Anesthesia S-I-40 [Propofol] Nausea And Vomiting    Prior to Admission medications   Medication Sig Start Date End Date Taking? Authorizing Provider  buPROPion (WELLBUTRIN) 100 MG tablet Take 100 mg by mouth 2 (two) times daily.   Yes [provider]  nystatin-triamcinolone (MYCOLOG II) cream Apply 1 application topically 2 (two) times daily as needed (irritation).   Yes [provider]  Prenatal Vit-Fe Fumarate-FA (PRENATAL MULTIVITAMIN) TABS tablet Take 1 tablet by mouth daily at 12 noon.   Yes [provider]  acetaminophen (TYLENOL) 500 MG tablet Take 2 tablets (1,000 mg total) by mouth every 6 (six) hours. 04/21/19   McVey, Murray Hodgkins, CNM  ibuprofen (ADVIL) 600 MG tablet Take  1 tablet (600 mg total) by mouth every 6 (six) hours. 04/21/19   McVey, Murray Hodgkins, CNM  oxyCODONE (OXY IR/ROXICODONE) 5 MG immediate release tablet Take 1 tablet (5 mg total) by mouth every 4 (four) hours as needed for moderate pain. 04/21/19   McVey, Murray Hodgkins, CNM  senna-docusate (SENOKOT-S) 8.6-50 MG tablet Take 2 tablets by mouth daily. 04/22/19   McVey, Murray Hodgkins, CNM  sertraline (ZOLOFT) 25 MG tablet Take 1 tablet (25 mg total) by mouth at bedtime. 04/21/19   McVey, Murray Hodgkins, CNM  simethicone (MYLICON) 80 MG chewable tablet Chew 2 tablets (160 mg total) by mouth 4 (four) times daily as needed for flatulence. 04/21/19   McVey, Murray Hodgkins, CNM     Prenatal care site: [facility]   Social History: She  reports that she has never smoked. She has never used smokeless tobacco. She reports that she does not drink alcohol or use drugs.  Family History: family history includes Breast cancer in her paternal grandmother.   Review of Systems: A full review of systems was performed and negative except as noted in the HPI.     Physical Exam:  Vital Signs: BP 118/80 (BP Location: Left Arm)   Pulse 94   Temp 98.4 F (36.9 C) (Oral)   Resp 18   Ht 5\' 3"  (1.6 m)   Wt 90.7 kg   LMP 07/20/2018   SpO2 100%   BMI 35.43 kg/m  General: no acute distress.  HEENT: normocephalic, atraumatic Heart:  regular rate & rhythm.  No murmurs/rubs/gallops Lungs: clear to auscultation bilaterally, normal respiratory effort Abdomen: soft, gravid, non-tender;  EFW: 7.14 Pelvic:   External: Normal external female genitalia    Extremities: non-tender, symmetric, mild edema bilaterally.  DTRs: 2+ Neurologic: Alert & oriented x 3.    No results found for this or any previous visit (from the past 24 hour(s)).  Pertinent Results:  Prenatal Labs: Blood type/Rh O pos  Antibody screen neg  Rubella Immune  Varicella Non-Immune  RPR NR  HBsAg Neg  HIV NR  GC neg  Chlamydia neg  Genetic screening declined   1 hour GTT 164  3 hour GTT 78, 146, 117, 57  GBS positive      Cephalic by leopolds   Assessment:  Kaitlyn Hamilton is a 23 y.o. G2P1001 female at 7890w0d with history of cesarean, term IUP, and desired repeat cesarean..   Plan:  1. Admit to Labor & Delivery 2. CBC, T&S, NPO, IVF 3. Abx ordered on call to OR 4. Consents obtained. 5. Continuous efm/toco until OR is ready  ----- Ranae Plumberhelsea Conrado Nance, MD Attending Obstetrician and Gynecologist [facility], Department of OB/GYN [facility]

## 2019-07-19 ENCOUNTER — Encounter (HOSPITAL_COMMUNITY): Payer: Self-pay

## 2020-01-01 ENCOUNTER — Observation Stay
Admission: EM | Admit: 2020-01-01 | Discharge: 2020-01-03 | Disposition: A | Discharge status: Home / Self Care | Payer: 59 | Attending: Surgery | Admitting: Surgery

## 2020-01-01 ENCOUNTER — Emergency Department: Payer: 59

## 2020-01-01 ENCOUNTER — Encounter: Payer: Self-pay | Admitting: Radiology

## 2020-01-01 ENCOUNTER — Other Ambulatory Visit: Payer: Self-pay

## 2020-01-01 DIAGNOSIS — Z793 Long term (current) use of hormonal contraceptives: Secondary | ICD-10-CM | POA: Diagnosis not present

## 2020-01-01 DIAGNOSIS — Z20822 Contact with and (suspected) exposure to covid-19: Secondary | ICD-10-CM | POA: Insufficient documentation

## 2020-01-01 DIAGNOSIS — K3580 Unspecified acute appendicitis: Secondary | ICD-10-CM | POA: Diagnosis present

## 2020-01-01 DIAGNOSIS — Z79899 Other long term (current) drug therapy: Secondary | ICD-10-CM | POA: Insufficient documentation

## 2020-01-01 DIAGNOSIS — K353 Acute appendicitis with localized peritonitis, without perforation or gangrene: Secondary | ICD-10-CM

## 2020-01-01 DIAGNOSIS — K3533 Acute appendicitis with perforation and localized peritonitis, with abscess: Secondary | ICD-10-CM | POA: Diagnosis not present

## 2020-01-01 DIAGNOSIS — T8859XA Other complications of anesthesia, initial encounter: Secondary | ICD-10-CM | POA: Diagnosis present

## 2020-01-01 LAB — LIPASE, BLOOD: Lipase: 26 U/L (ref 11–51)

## 2020-01-01 LAB — URINALYSIS, COMPLETE (UACMP) WITH MICROSCOPIC
Bilirubin Urine: NEGATIVE
Glucose, UA: NEGATIVE mg/dL
Hgb urine dipstick: NEGATIVE
Ketones, ur: NEGATIVE mg/dL
Leukocytes,Ua: NEGATIVE
Nitrite: NEGATIVE
Protein, ur: NEGATIVE mg/dL
Specific Gravity, Urine: 1.013 (ref 1.005–1.030)
pH: 5 (ref 5.0–8.0)

## 2020-01-01 LAB — CBC
HCT: 43.1 % (ref 36.0–46.0)
Hemoglobin: 14.7 g/dL (ref 12.0–15.0)
MCH: 29.2 pg (ref 26.0–34.0)
MCHC: 34.1 g/dL (ref 30.0–36.0)
MCV: 85.7 fL (ref 80.0–100.0)
Platelets: 231 10*3/uL (ref 150–400)
RBC: 5.03 MIL/uL (ref 3.87–5.11)
RDW: 12 % (ref 11.5–15.5)
WBC: 13.7 10*3/uL — ABNORMAL HIGH (ref 4.0–10.5)
nRBC: 0 % (ref 0.0–0.2)

## 2020-01-01 LAB — COMPREHENSIVE METABOLIC PANEL
ALT: 19 U/L (ref 0–44)
AST: 20 U/L (ref 15–41)
Albumin: 4.5 g/dL (ref 3.5–5.0)
Alkaline Phosphatase: 83 U/L (ref 38–126)
Anion gap: 8 (ref 5–15)
BUN: 11 mg/dL (ref 6–20)
CO2: 27 mmol/L (ref 22–32)
Calcium: 10.7 mg/dL — ABNORMAL HIGH (ref 8.9–10.3)
Chloride: 104 mmol/L (ref 98–111)
Creatinine, Ser: 0.63 mg/dL (ref 0.44–1.00)
GFR calc Af Amer: >60 mL/min (ref >60)
GFR calc non Af Amer: >60 mL/min (ref >60)
Glucose, Bld: 136 mg/dL — ABNORMAL HIGH (ref 70–99)
Potassium: 3.9 mmol/L (ref 3.5–5.1)
Sodium: 139 mmol/L (ref 135–145)
Total Bilirubin: 0.8 mg/dL (ref 0.3–1.2)
Total Protein: 7.5 g/dL (ref 6.5–8.1)

## 2020-01-01 LAB — POCT PREGNANCY, URINE: Preg Test, Ur: NEGATIVE

## 2020-01-01 LAB — SARS CORONAVIRUS 2 BY RT PCR (HOSPITAL ORDER, PERFORMED IN ~~LOC~~ HOSPITAL LAB): SARS Coronavirus 2: NEGATIVE

## 2020-01-01 MED ORDER — IOHEXOL 300 MG/ML  SOLN
100.0000 mL | Freq: Once | INTRAMUSCULAR | Status: AC | PRN
  Administered 2020-01-01: 100 mL via INTRAVENOUS
  Filled 2020-01-01: qty 100

## 2020-01-01 MED ORDER — HYDROMORPHONE HCL 1 MG/ML IJ SOLN
0.5000 mg | INTRAMUSCULAR | Status: DC | PRN
  Administered 2020-01-01: 0.5 mg via INTRAVENOUS
  Filled 2020-01-01: qty 1

## 2020-01-01 MED ORDER — HYDROCODONE-ACETAMINOPHEN 5-325 MG PO TABS
1.0000 | ORAL_TABLET | ORAL | Status: DC | PRN
  Administered 2020-01-02: 1 via ORAL
  Filled 2020-01-01: qty 1

## 2020-01-01 MED ORDER — PANTOPRAZOLE SODIUM 40 MG IV SOLR
40.0000 mg | Freq: Every day | INTRAVENOUS | Status: DC
  Administered 2020-01-02 (×2): 40 mg via INTRAVENOUS
  Filled 2020-01-01 (×2): qty 40

## 2020-01-01 MED ORDER — ONDANSETRON HCL 4 MG/2ML IJ SOLN
4.0000 mg | Freq: Once | INTRAMUSCULAR | Status: AC
  Administered 2020-01-01: 4 mg via INTRAVENOUS
  Filled 2020-01-01: qty 2

## 2020-01-01 MED ORDER — ONDANSETRON 4 MG PO TBDP: 4.0000 mg | ORAL_TABLET | Freq: Four times a day (QID) | ORAL | Status: DC | PRN

## 2020-01-01 MED ORDER — SODIUM CHLORIDE 0.9 % IV SOLN
2.0000 g | INTRAVENOUS | Status: DC
  Administered 2020-01-01: 2 g via INTRAVENOUS
  Filled 2020-01-01 (×2): qty 20

## 2020-01-01 MED ORDER — MORPHINE SULFATE (PF) 4 MG/ML IV SOLN
4.0000 mg | Freq: Once | INTRAVENOUS | Status: AC
  Administered 2020-01-01: 4 mg via INTRAVENOUS
  Filled 2020-01-01: qty 1

## 2020-01-01 MED ORDER — SODIUM CHLORIDE 0.9 % IV SOLN: INTRAVENOUS | Status: DC

## 2020-01-01 MED ORDER — METRONIDAZOLE IN NACL 5-0.79 MG/ML-% IV SOLN
500.0000 mg | Freq: Three times a day (TID) | INTRAVENOUS | Status: DC
  Administered 2020-01-02 (×2): 500 mg via INTRAVENOUS
  Filled 2020-01-01 (×4): qty 100

## 2020-01-01 MED ORDER — DOCUSATE SODIUM 100 MG PO CAPS: 100.0000 mg | ORAL_CAPSULE | Freq: Two times a day (BID) | ORAL | Status: DC | PRN

## 2020-01-01 MED ORDER — TRAMADOL HCL 50 MG PO TABS
50.0000 mg | ORAL_TABLET | Freq: Four times a day (QID) | ORAL | Status: DC | PRN
  Administered 2020-01-02 – 2020-01-03 (×2): 50 mg via ORAL
  Filled 2020-01-01 (×2): qty 1

## 2020-01-01 MED ORDER — ONDANSETRON HCL 4 MG/2ML IJ SOLN: 4.0000 mg | Freq: Four times a day (QID) | INTRAMUSCULAR | Status: DC | PRN

## 2020-01-01 NOTE — ED Notes (Signed)
Dr. Tonna Boehringer to bedside

## 2020-01-01 NOTE — ED Provider Notes (Signed)
[facility] Emergency Department Provider Note   ____________________________________________    I have reviewed the triage vital signs and the nursing notes.   HISTORY  Chief Complaint Abdominal Pain     HPI Kaitlyn Hamilton is a 24 y.o. female with a history of 2 cesarean sections presents with complaints of right lower quadrant abdominal pain.  Patient reports this morning she felt periumbilical discomfort and mildly nauseated.  The pain gradually traveled to her right lower quadrant and has worsened.  She did go to urgent care and they sent her to the emergency department for evaluation for possible appendicitis.  Denies fevers or chills.  Is not take anything for this.  No history of similar pain.  No dysuria.  No hematuria  Past Medical History:  Diagnosis Date  . Complication of anesthesia    nausea and vomiting for 3 days after delivery  . Family history of adverse reaction to anesthesia    mom - PONV  . Hypertrophy of tonsils and adenoids     Patient Active Problem List   Diagnosis Date Noted  . Labor and delivery indication for care or intervention 04/13/2019    Past Surgical History:  Procedure Laterality Date  . CESAREAN SECTION    . CESAREAN SECTION N/A 04/19/2019   Procedure: CESAREAN SECTION, REPEAT;  Surgeon: Ward, Elenora Fender, MD;  Location: [facility] ORS;  Service: Obstetrics;  Laterality: N/A;  . TONSILLECTOMY AND ADENOIDECTOMY N/A 05/06/2016   Procedure: TONSILLECTOMY AND ADENOIDECTOMY;  Surgeon: Linus Salmons, MD;  Location: [facility];  Service: ENT;  Laterality: N/A;  . WISDOM TOOTH EXTRACTION      Prior to Admission medications   Medication Sig Start Date End Date Taking? Authorizing Provider  acetaminophen (TYLENOL) 500 MG tablet Take 2 tablets (1,000 mg total) by mouth every 6 (six) hours. 04/21/19   McVey, Prudencio Pair, CNM  buPROPion (WELLBUTRIN) 100 MG tablet Take 100 mg by mouth 2 (two) times daily.     [provider]  ibuprofen (ADVIL) 600 MG tablet Take 1 tablet (600 mg total) by mouth every 6 (six) hours. 04/21/19   McVey, Prudencio Pair, CNM  nystatin-triamcinolone (MYCOLOG II) cream Apply 1 application topically 2 (two) times daily as needed (irritation).    [provider]  oxyCODONE (OXY IR/ROXICODONE) 5 MG immediate release tablet Take 1 tablet (5 mg total) by mouth every 4 (four) hours as needed for moderate pain. 04/21/19   McVey, Prudencio Pair, CNM  Prenatal Vit-Fe Fumarate-FA (PRENATAL MULTIVITAMIN) TABS tablet Take 1 tablet by mouth daily at 12 noon.    [provider]  senna-docusate (SENOKOT-S) 8.6-50 MG tablet Take 2 tablets by mouth daily. 04/22/19   McVey, Prudencio Pair, CNM  sertraline (ZOLOFT) 25 MG tablet Take 1 tablet (25 mg total) by mouth at bedtime. 04/21/19   McVey, Prudencio Pair, CNM  simethicone (MYLICON) 80 MG chewable tablet Chew 2 tablets (160 mg total) by mouth 4 (four) times daily as needed for flatulence. 04/21/19   McVey, Prudencio Pair, CNM     Allergies Anesthesia s-i-40 [propofol]  Family History  Problem Relation Age of Onset  . Breast cancer Paternal Grandmother     Social History Social History   Tobacco Use  . Smoking status: Never Smoker  . Smokeless tobacco: Never Used  Substance Use Topics  . Alcohol use: No  . Drug use: No    Review of Systems  Constitutional: No fever/chills Eyes: No visual changes.  ENT: No  sore throat. Cardiovascular: Denies chest pain. Respiratory: Denies shortness of breath. Gastrointestinal: As above Genitourinary: As above Musculoskeletal: Negative for back pain. Skin: Negative for rash. Neurological: Negative for headaches   ____________________________________________   PHYSICAL EXAM:  VITAL SIGNS: ED Triage Vitals  Enc Vitals Group     BP 01/01/20 1544 127/68     Pulse Rate 01/01/20 1544 78     Resp 01/01/20 1544 16     Temp 01/01/20 1544 98.3 F (36.8 C)     Temp Source 01/01/20 1544  Oral     SpO2 01/01/20 1544 98 %     Weight 01/01/20 1545 76.7 kg (169 lb)     Height 01/01/20 1545 1.6 m (5\' 3" )     Head Circumference --      Peak Flow --      Pain Score 01/01/20 1544 4     Pain Loc --      Pain Edu? --      Excl. in GC? --     Constitutional: Alert and oriented.   Nose: No congestion/rhinnorhea. Mouth/Throat: Mucous membranes are moist.   Cardiovascular: Normal rate, regular rhythm. Grossly normal heart sounds.  Good peripheral circulation. Respiratory: Normal respiratory effort.  No retractions. Lungs CTAB. Gastrointestinal: Tenderness palpation the right lower quadrant. No distention.  No CVA tenderness. Musculoskeletal: No lower extremity tenderness nor edema.  Warm and well perfused Neurologic:  Normal speech and language. No gross focal neurologic deficits are appreciated.  Skin:  Skin is warm, dry and intact. No rash noted. Psychiatric: Mood and affect are normal. Speech and behavior are normal.  ____________________________________________   LABS (all labs ordered are listed, but only abnormal results are displayed)  Labs Reviewed  COMPREHENSIVE METABOLIC PANEL - Abnormal; Notable for the following components:      Result Value   Glucose, Bld 136 (*)    Calcium 10.7 (*)    All other components within normal limits  CBC - Abnormal; Notable for the following components:   WBC 13.7 (*)    All other components within normal limits  URINALYSIS, COMPLETE (UACMP) WITH MICROSCOPIC - Abnormal; Notable for the following components:   Color, Urine YELLOW (*)    APPearance CLEAR (*)    Bacteria, UA RARE (*)    All other components within normal limits  SARS CORONAVIRUS 2 BY RT PCR (HOSPITAL ORDER, PERFORMED IN [facility])  LIPASE, BLOOD  POC URINE PREG, ED  POCT PREGNANCY, URINE   ____________________________________________  EKG  None ____________________________________________  RADIOLOGY  Contacted by radiologist and informed  of acute appendicitis on CT abdomen pelvis ____________________________________________   PROCEDURES  Procedure(s) performed: No  Procedures   Critical Care performed: No ____________________________________________   INITIAL IMPRESSION / ASSESSMENT AND PLAN / ED COURSE  Pertinent labs & imaging results that were available during my care of the patient were reviewed by me and considered in my medical decision making (see chart for details).  Patient presents with right lower quadrant abdominal pain with tenderness on exam.  Mildly elevated white blood cell count.  Highly suspicious for appendicitis.  Differential includes UTI, kidney stone, colitis, ovarian cyst.  IV morphine and IV Zofran given for pain and nausea.  CT imaging ordered.  Contacted by radiologist and informed of acute uncomplicated appendicitis.  Discussed with patient and I have consulted Dr. 03/02/20 of general surgery, he will see the patient in the emergency department    ____________________________________________   FINAL CLINICAL IMPRESSION(S) / ED DIAGNOSES  Final diagnoses:  Acute appendicitis, unspecified acute appendicitis type        Note:  This document was prepared using Dragon voice recognition software and may include unintentional dictation errors.   Lavonia Drafts, MD 01/01/20 1701

## 2020-01-01 NOTE — ED Notes (Signed)
Pt called out c/o 8/10 abdominal pain. Dr. Cyril Loosen, MD made aware.

## 2020-01-01 NOTE — ED Notes (Signed)
PT taken to CT.

## 2020-01-01 NOTE — ED Triage Notes (Signed)
First Nurse Note:  Sent for ED evaluation from Urgent Care for ED evaluation for possible appendicitis.    Patient is AAOx3.  Skin warm and dry.  NAD.Marland Kitchen Posture upright and relaxed.

## 2020-01-01 NOTE — H&P (Signed)
Subjective:   CC: Acute appendicitis  HPI:  Kaitlyn Hamilton is a 24 y.o. female who is consulted by Corky Downs for evaluation of  above cc.  Symptoms were first noted 1 day ago. Pain is sharp, periumbilical and now migrated to lower quadrant.  Associated with nausea, exacerbated by nothing specific.     Past Medical History:  has a past medical history of Complication of anesthesia, Family history of adverse reaction to anesthesia, and Hypertrophy of tonsils and adenoids.  Past Surgical History:  has a past surgical history that includes Wisdom tooth extraction; Tonsillectomy and adenoidectomy (N/A, 05/06/2016); Cesarean section; and Cesarean section (N/A, 04/19/2019).  Family History: family history includes Breast cancer in her paternal grandmother.  Social History:  reports that she has never smoked. She has never used smokeless tobacco. She reports that she does not drink alcohol or use drugs.  Current Medications:  Brewers Yeast 500 MG TABS Take 1 tablet by mouth as directed. Lysle Pearl, Mickie Kozikowski, DO Not Ordered  [name] 0.35 MG tablet Take 1 tablet by mouth daily. Lysle Pearl, Sallyann Kinnaird, DO Not Ordered  magnesium oxide (MAG-OX) 400 MG tablet Take 400 mg by mouth daily. Benjamine Sprague, DO Not Ordered  Potassium 99 MG TABS Take 99 mg by mouth daily. Benjamine Sprague, DO Not Ordered  Prenatal Vit-Fe Fumarate-FA (PRENATAL MULTIVITAMIN) TABS tablet Take 1 tablet by mouth daily.  Benjamine Sprague, DO Not Ordered     Allergies:  Allergies as of 01/01/2020 - Review Complete 04/19/2019  Allergen Reaction Noted  . Anesthesia s-i-40 [propofol] Nausea And Vomiting 04/09/2019    ROS:  General: Denies weight loss, weight gain, fatigue, fevers, chills, and night sweats. Eyes: Denies blurry vision, double vision, eye pain, itchy eyes, and tearing. Ears: Denies hearing loss, earache, and ringing in ears. Nose: Denies sinus pain, congestion, infections, runny nose, and nosebleeds. Mouth/throat: Denies hoarseness, sore  throat, bleeding gums, and difficulty swallowing. Heart: Denies chest pain, palpitations, racing heart, irregular heartbeat, leg pain or swelling, and decreased activity tolerance. Respiratory: Denies breathing difficulty, shortness of breath, wheezing, cough, and sputum. GI: Denies change in appetite, heartburn, nausea, vomiting, constipation, diarrhea, and blood in stool. GU: Denies difficulty urinating, pain with urinating, urgency, frequency, blood in urine. Musculoskeletal: Denies joint stiffness, pain, swelling, muscle weakness. Skin: Denies rash, itching, mass, tumors, sores, and boils Neurologic: Denies headache, fainting, dizziness, seizures, numbness, and tingling. Psychiatric: Denies depression, anxiety, difficulty sleeping, and memory loss. Endocrine: Denies heat or cold intolerance, and increased thirst or urination. Blood/lymph: Denies easy bruising, easy bruising, and swollen glands     Objective:     BP 97/61   Pulse 82   Temp 98.3 F (36.8 C) (Oral)   Resp 16   Ht 5\' 3"  (1.6 m)   Wt 76.7 kg   SpO2 100%   BMI 29.94 kg/m    Constitutional :  alert, cooperative, appears stated age and no distress  Lymphatics/Throat:  no asymmetry, masses, or scars  Respiratory:  clear to auscultation bilaterally  Cardiovascular:  regular rate and rhythm  Gastrointestinal: Soft, focal tenderness in right lower quadrant, guarding or rebound tenderness..   Musculoskeletal: Steady gait and movement  Skin: Cool and moist  Psychiatric: Normal affect, non-agitated, not confused       LABS:  CMP Latest Ref Rng & Units 01/01/2020  Glucose 70 - 99 mg/dL 136(H)  BUN 6 - 20 mg/dL 11  Creatinine 0.44 - 1.00 mg/dL 0.63  Sodium 135 - 145 mmol/L 139  Potassium 3.5 - 5.1  mmol/L 3.9  Chloride 98 - 111 mmol/L 104  CO2 22 - 32 mmol/L 27  Calcium 8.9 - 10.3 mg/dL 10.7(H)  Total Protein 6.5 - 8.1 g/dL 7.5  Total Bilirubin 0.3 - 1.2 mg/dL 0.8  Alkaline Phos 38 - 126 U/L 83  AST 15 - 41 U/L  20  ALT 0 - 44 U/L 19   CBC Latest Ref Rng & Units 01/01/2020 04/20/2019 04/19/2019  WBC 4.0 - 10.5 K/uL 13.7(H) 13.1(H) 10.5  Hemoglobin 12.0 - 15.0 g/dL 82.9 10.3(L) 12.2  Hematocrit 36.0 - 46.0 % 43.1 30.8(L) 36.0  Platelets 150 - 400 K/uL 231 155 179     RADS: CLINICAL DATA:  Right lower quadrant abdominal pain for 1 day  EXAM: CT ABDOMEN AND PELVIS WITH CONTRAST  TECHNIQUE: Multidetector CT imaging of the abdomen and pelvis was performed using the standard protocol following bolus administration of intravenous contrast.  CONTRAST:  OMNIPAQUE IOHEXOL 300 MG/ML  SOLN  COMPARISON:  None.  FINDINGS: Lower chest: Ovoid fluid density 3.4 x 3.4 cm structure adjacent to the pericardium at the anterior right lung base (series 5, image 29), which may represent a pericardial cyst. Lung bases are clear.  Hepatobiliary: No focal liver abnormality is seen. No gallstones, gallbladder wall thickening, or biliary dilatation.  Pancreas: Unremarkable. No pancreatic ductal dilatation or surrounding inflammatory changes.  Spleen: Normal in size without focal abnormality.  Adrenals/Urinary Tract: Adrenal glands are unremarkable. Kidneys are normal, without renal calculi, focal lesion, or hydronephrosis. Bladder is unremarkable for the degree of distension.  Stomach/Bowel: The appendix is dilated with thickened, enhancing walls and surrounding periappendiceal fat stranding (series 5, images 33-38). No adjacent fluid collection, abscess, or evidence of perforation. Stomach and small bowel are unremarkable. No evidence of bowel obstruction. No focal colonic thickening.  Vascular/Lymphatic: No significant vascular findings are present. No enlarged abdominal or pelvic lymph nodes.  Reproductive: Uterus and bilateral adnexa are unremarkable.  Other: No abdominal wall hernia. No pneumoperitoneum.  Musculoskeletal: No acute or significant osseous  findings.  IMPRESSION: 1. Acute uncomplicated appendicitis. 2. Ovoid fluid density structure adjacent to the pericardium at the anterior right lung base measuring 3.4 cm, which may represent a pericardial cyst. Finding is likely incidental. If there are symptoms attributable to this lesion, a cardiothoracic surgical consultation should be considered.  These results were called by telephone at the time of interpretation on 01/01/2020 at 4:52 pm to provider Jene Every , who verbally acknowledged these results.   Electronically Signed   By: Duanne Guess D.O.   On: 01/01/2020 16:54  Assessment:      Acute appendicitis  Plan:      Discussed the risk of surgery including post-op infxn, seroma, hematoma, abscess formation, chronic pain, poor-delayed wound healing, possible bowel resection, possible ostomy, possible conversion to open procedure, post-op SBO or ileus, and need for additional procedures to address said risks.  The risks of general anesthetic including MI, CVA, sudden death or even reaction to anesthetic medications also discussed. Alternatives include continued observation, or antibiotic treatment.  Benefits include possible symptom relief,   Typical post operative recovery of 3-5 days rest, also discussed.  The patient understands the risks, any and all questions were answered to the patient's satisfaction.  OR for lap appendectomy.  IV fluids, antibiotics while waiting for room availability

## 2020-01-01 NOTE — ED Notes (Signed)
PT denies N/V/D and fever

## 2020-01-02 ENCOUNTER — Inpatient Hospital Stay: Payer: 59 | Admitting: Anesthesiology

## 2020-01-02 ENCOUNTER — Encounter
Admission: EM | Disposition: A | Discharge status: Home / Self Care | Payer: Self-pay | Source: Home / Self Care | Attending: Emergency Medicine

## 2020-01-02 ENCOUNTER — Encounter: Payer: Self-pay | Admitting: Surgery

## 2020-01-02 ENCOUNTER — Other Ambulatory Visit: Payer: Self-pay

## 2020-01-02 DIAGNOSIS — T8859XA Other complications of anesthesia, initial encounter: Secondary | ICD-10-CM | POA: Diagnosis present

## 2020-01-02 HISTORY — PX: LAPAROSCOPIC APPENDECTOMY: SHX408

## 2020-01-02 LAB — CBC
HCT: 38.1 % (ref 36.0–46.0)
Hemoglobin: 12.9 g/dL (ref 12.0–15.0)
MCH: 29.1 pg (ref 26.0–34.0)
MCHC: 33.9 g/dL (ref 30.0–36.0)
MCV: 86 fL (ref 80.0–100.0)
Platelets: 203 10*3/uL (ref 150–400)
RBC: 4.43 MIL/uL (ref 3.87–5.11)
RDW: 12.1 % (ref 11.5–15.5)
WBC: 9.4 10*3/uL (ref 4.0–10.5)
nRBC: 0 % (ref 0.0–0.2)

## 2020-01-02 LAB — HIV ANTIBODY (ROUTINE TESTING W REFLEX): HIV Screen 4th Generation wRfx: NONREACTIVE

## 2020-01-02 LAB — BASIC METABOLIC PANEL
Anion gap: 6 (ref 5–15)
BUN: 9 mg/dL (ref 6–20)
CO2: 26 mmol/L (ref 22–32)
Calcium: 8.3 mg/dL — ABNORMAL LOW (ref 8.9–10.3)
Chloride: 106 mmol/L (ref 98–111)
Creatinine, Ser: 0.6 mg/dL (ref 0.44–1.00)
GFR calc Af Amer: >60 mL/min (ref >60)
GFR calc non Af Amer: >60 mL/min (ref >60)
Glucose, Bld: 100 mg/dL — ABNORMAL HIGH (ref 70–99)
Potassium: 3.8 mmol/L (ref 3.5–5.1)
Sodium: 138 mmol/L (ref 135–145)

## 2020-01-02 LAB — PHOSPHORUS: Phosphorus: 4.4 mg/dL (ref 2.5–4.6)

## 2020-01-02 LAB — MAGNESIUM: Magnesium: 1.8 mg/dL (ref 1.7–2.4)

## 2020-01-02 SURGERY — APPENDECTOMY, LAPAROSCOPIC
Anesthesia: General

## 2020-01-02 MED ORDER — ACETAMINOPHEN 325 MG PO TABS: 325.0000 mg | ORAL_TABLET | ORAL | Status: DC | PRN

## 2020-01-02 MED ORDER — FENTANYL CITRATE (PF) 100 MCG/2ML IJ SOLN
INTRAMUSCULAR | Status: DC | PRN
  Administered 2020-01-02 (×4): 50 ug via INTRAVENOUS

## 2020-01-02 MED ORDER — ACETAMINOPHEN 500 MG PO TABS
1000.0000 mg | ORAL_TABLET | ORAL | Status: AC
  Administered 2020-01-02: 1000 mg via ORAL

## 2020-01-02 MED ORDER — ONDANSETRON HCL 4 MG/2ML IJ SOLN
INTRAMUSCULAR | Status: DC | PRN
  Administered 2020-01-02: 4 mg via INTRAVENOUS

## 2020-01-02 MED ORDER — FENTANYL CITRATE (PF) 100 MCG/2ML IJ SOLN
INTRAMUSCULAR | Status: AC
  Filled 2020-01-02: qty 2

## 2020-01-02 MED ORDER — DIPHENHYDRAMINE HCL 50 MG/ML IJ SOLN
INTRAMUSCULAR | Status: DC | PRN
  Administered 2020-01-02: 12.5 mg via INTRAVENOUS

## 2020-01-02 MED ORDER — BUPIVACAINE-EPINEPHRINE (PF) 0.5% -1:200000 IJ SOLN
INTRAMUSCULAR | Status: AC
  Filled 2020-01-02: qty 90

## 2020-01-02 MED ORDER — DIPHENHYDRAMINE HCL 50 MG/ML IJ SOLN
INTRAMUSCULAR | Status: AC
  Filled 2020-01-02: qty 1

## 2020-01-02 MED ORDER — SCOPOLAMINE 1 MG/3DAYS TD PT72
MEDICATED_PATCH | TRANSDERMAL | Status: AC
  Filled 2020-01-02: qty 1

## 2020-01-02 MED ORDER — KETOROLAC TROMETHAMINE 30 MG/ML IJ SOLN: 30.0000 mg | Freq: Once | INTRAMUSCULAR | Status: DC | PRN

## 2020-01-02 MED ORDER — MIDAZOLAM HCL 2 MG/2ML IJ SOLN
INTRAMUSCULAR | Status: AC
  Filled 2020-01-02: qty 2

## 2020-01-02 MED ORDER — ACETAMINOPHEN 500 MG PO TABS
ORAL_TABLET | ORAL | Status: AC
  Filled 2020-01-02: qty 2

## 2020-01-02 MED ORDER — PROPOFOL 10 MG/ML IV BOLUS
INTRAVENOUS | Status: DC | PRN
  Administered 2020-01-02: 150 mg via INTRAVENOUS

## 2020-01-02 MED ORDER — FENTANYL CITRATE (PF) 100 MCG/2ML IJ SOLN
25.0000 ug | INTRAMUSCULAR | Status: DC | PRN
  Administered 2020-01-02 (×2): 25 ug via INTRAVENOUS

## 2020-01-02 MED ORDER — SUCCINYLCHOLINE CHLORIDE 200 MG/10ML IV SOSY
PREFILLED_SYRINGE | INTRAVENOUS | Status: AC
  Filled 2020-01-02: qty 10

## 2020-01-02 MED ORDER — GABAPENTIN 300 MG PO CAPS
ORAL_CAPSULE | ORAL | Status: AC
  Filled 2020-01-02: qty 1

## 2020-01-02 MED ORDER — EPHEDRINE SULFATE 50 MG/ML IJ SOLN
INTRAMUSCULAR | Status: DC | PRN
  Administered 2020-01-02 (×2): 5 mg via INTRAVENOUS

## 2020-01-02 MED ORDER — DEXMEDETOMIDINE HCL IN NACL 400 MCG/100ML IV SOLN
INTRAVENOUS | Status: DC | PRN
Start: 2020-01-02 — End: 2020-01-02
  Administered 2020-01-02 (×2): 4 ug via INTRAVENOUS
  Administered 2020-01-02: 8 ug via INTRAVENOUS
  Administered 2020-01-02: 4 ug via INTRAVENOUS

## 2020-01-02 MED ORDER — SUCCINYLCHOLINE CHLORIDE 20 MG/ML IJ SOLN
INTRAMUSCULAR | Status: DC | PRN
  Administered 2020-01-02: 140 mg via INTRAVENOUS

## 2020-01-02 MED ORDER — ONDANSETRON HCL 4 MG/2ML IJ SOLN
INTRAMUSCULAR | Status: AC
  Filled 2020-01-02: qty 2

## 2020-01-02 MED ORDER — LIDOCAINE HCL 1 % IJ SOLN
INTRAMUSCULAR | Status: DC | PRN
  Administered 2020-01-02: 9 mL via INTRAMUSCULAR

## 2020-01-02 MED ORDER — PROMETHAZINE HCL 25 MG/ML IJ SOLN: 6.2500 mg | INTRAMUSCULAR | Status: DC | PRN

## 2020-01-02 MED ORDER — CELECOXIB 200 MG PO CAPS
400.0000 mg | ORAL_CAPSULE | ORAL | Status: AC
  Administered 2020-01-02: 400 mg via ORAL

## 2020-01-02 MED ORDER — LIDOCAINE HCL (PF) 2 % IJ SOLN
INTRAMUSCULAR | Status: AC
  Filled 2020-01-02: qty 5

## 2020-01-02 MED ORDER — LIDOCAINE HCL (PF) 1 % IJ SOLN
INTRAMUSCULAR | Status: AC
  Filled 2020-01-02: qty 30

## 2020-01-02 MED ORDER — LIDOCAINE HCL (CARDIAC) PF 100 MG/5ML IV SOSY
PREFILLED_SYRINGE | INTRAVENOUS | Status: DC | PRN
  Administered 2020-01-02: 60 mg via INTRAVENOUS

## 2020-01-02 MED ORDER — SCOPOLAMINE 1 MG/3DAYS TD PT72
1.0000 | MEDICATED_PATCH | TRANSDERMAL | Status: DC
  Administered 2020-01-02: 1.5 mg via TRANSDERMAL

## 2020-01-02 MED ORDER — PROPOFOL 500 MG/50ML IV EMUL
INTRAVENOUS | Status: DC | PRN
  Administered 2020-01-02: 20 ug/kg/min via INTRAVENOUS

## 2020-01-02 MED ORDER — PROPOFOL 500 MG/50ML IV EMUL
INTRAVENOUS | Status: AC
  Filled 2020-01-02: qty 50

## 2020-01-02 MED ORDER — ACETAMINOPHEN 160 MG/5ML PO SOLN
325.0000 mg | ORAL | Status: DC | PRN
  Filled 2020-01-02: qty 20.3

## 2020-01-02 MED ORDER — DEXMEDETOMIDINE HCL IN NACL 80 MCG/20ML IV SOLN
INTRAVENOUS | Status: AC
  Filled 2020-01-02: qty 20

## 2020-01-02 MED ORDER — HEMOSTATIC AGENTS (NO CHARGE) OPTIME
TOPICAL | Status: DC | PRN
  Administered 2020-01-02: 1 via TOPICAL

## 2020-01-02 MED ORDER — DEXAMETHASONE SODIUM PHOSPHATE 10 MG/ML IJ SOLN
INTRAMUSCULAR | Status: DC | PRN
  Administered 2020-01-02: 10 mg via INTRAVENOUS

## 2020-01-02 MED ORDER — GABAPENTIN 300 MG PO CAPS
300.0000 mg | ORAL_CAPSULE | ORAL | Status: AC
  Administered 2020-01-02: 300 mg via ORAL

## 2020-01-02 MED ORDER — ENOXAPARIN SODIUM 40 MG/0.4ML ~~LOC~~ SOLN: 40.0000 mg | SUBCUTANEOUS | Status: DC

## 2020-01-02 MED ORDER — DEXAMETHASONE SODIUM PHOSPHATE 10 MG/ML IJ SOLN
INTRAMUSCULAR | Status: AC
  Filled 2020-01-02: qty 1

## 2020-01-02 MED ORDER — PHENYLEPHRINE HCL (PRESSORS) 10 MG/ML IV SOLN
INTRAVENOUS | Status: DC | PRN
  Administered 2020-01-02 (×2): 100 ug via INTRAVENOUS
  Administered 2020-01-02: 150 ug via INTRAVENOUS

## 2020-01-02 MED ORDER — MIDAZOLAM HCL 2 MG/2ML IJ SOLN
INTRAMUSCULAR | Status: DC | PRN
  Administered 2020-01-02: 2 mg via INTRAVENOUS

## 2020-01-02 MED ORDER — CELECOXIB 200 MG PO CAPS
ORAL_CAPSULE | ORAL | Status: AC
  Filled 2020-01-02: qty 2

## 2020-01-02 MED ORDER — PROPOFOL 10 MG/ML IV BOLUS
INTRAVENOUS | Status: AC
  Filled 2020-01-02: qty 20

## 2020-01-02 SURGICAL SUPPLY — 53 items
ADH SKN CLS APL DERMABOND .7 (GAUZE/BANDAGES/DRESSINGS) ×1
ANCHOR TIS RET SYS 235ML (MISCELLANEOUS) ×3 IMPLANT
APPLIER CLIP 5 13 M/L LIGAMAX5 (MISCELLANEOUS)
APR CLP MED LRG 5 ANG JAW (MISCELLANEOUS)
BAG TISS RTRVL C235 10X14 (MISCELLANEOUS) ×1
BLADE SURG SZ11 CARB STEEL (BLADE) ×3 IMPLANT
CANISTER SUCT 1200ML W/VALVE (MISCELLANEOUS) ×3 IMPLANT
CLIP APPLIE 5 13 M/L LIGAMAX5 (MISCELLANEOUS) IMPLANT
COVER WAND RF STERILE (DRAPES) IMPLANT
CUTTER FLEX LINEAR 45M (STAPLE) ×3 IMPLANT
DEFOGGER SCOPE WARMER CLEARIFY (MISCELLANEOUS) ×3 IMPLANT
DERMABOND ADVANCED (GAUZE/BANDAGES/DRESSINGS) ×2
DERMABOND ADVANCED .7 DNX12 (GAUZE/BANDAGES/DRESSINGS) ×1 IMPLANT
ELECT REM PT RETURN 9FT ADLT (ELECTROSURGICAL) ×3
ELECTRODE REM PT RTRN 9FT ADLT (ELECTROSURGICAL) ×1 IMPLANT
GLOVE BIOGEL PI IND STRL 7.0 (GLOVE) ×1 IMPLANT
GLOVE BIOGEL PI INDICATOR 7.0 (GLOVE) ×2
GLOVE SURG SYN 6.5 ES PF (GLOVE) ×3 IMPLANT
GLOVE SURG SYN 6.5 PF PI (GLOVE) ×1 IMPLANT
GOWN STRL REUS W/ TWL LRG LVL3 (GOWN DISPOSABLE) ×1 IMPLANT
GOWN STRL REUS W/TWL LRG LVL3 (GOWN DISPOSABLE) ×3
GRASPER SUT TROCAR 14GX15 (MISCELLANEOUS) ×3 IMPLANT
HANDLE YANKAUER SUCT BULB TIP (MISCELLANEOUS) ×3 IMPLANT
HEMOSTAT SURGICEL 2X3 (HEMOSTASIS) ×2 IMPLANT
IRRIGATION STRYKERFLOW (MISCELLANEOUS) IMPLANT
IRRIGATOR STRYKERFLOW (MISCELLANEOUS)
IV NS 1000ML (IV SOLUTION)
IV NS 1000ML BAXH (IV SOLUTION) IMPLANT
JACKSON PRATT 10 (INSTRUMENTS) IMPLANT
KIT TURNOVER KIT A (KITS) ×3 IMPLANT
L-HOOK LAP DISP 36CM (ELECTROSURGICAL) ×3
LHOOK LAP DISP 36CM (ELECTROSURGICAL) ×1 IMPLANT
LIGASURE LAP MARYLAND 5MM 37CM (ELECTROSURGICAL) IMPLANT
NEEDLE HYPO 22GX1.5 SAFETY (NEEDLE) ×3 IMPLANT
NEEDLE VERESS 14GA 120MM (NEEDLE) ×2 IMPLANT
PACK LAP CHOLECYSTECTOMY (MISCELLANEOUS) ×3 IMPLANT
PENCIL ELECTRO HAND CTR (MISCELLANEOUS) ×3 IMPLANT
RELOAD 45 VASCULAR/THIN (ENDOMECHANICALS) ×3 IMPLANT
RELOAD STAPLE 45 2.5 WHT GRN (ENDOMECHANICALS) IMPLANT
RELOAD STAPLE 45 3.5 BLU ETS (ENDOMECHANICALS) ×1 IMPLANT
RELOAD STAPLE TA45 3.5 REG BLU (ENDOMECHANICALS) ×3 IMPLANT
SCISSORS METZENBAUM CVD 33 (INSTRUMENTS) ×3 IMPLANT
SET TUBE SMOKE EVAC HIGH FLOW (TUBING) ×3 IMPLANT
SLEEVE ENDOPATH XCEL 5M (ENDOMECHANICALS) ×3 IMPLANT
SUT MNCRL AB 4-0 PS2 18 (SUTURE) ×3 IMPLANT
SUT VIC AB 3-0 SH 27 (SUTURE) ×3
SUT VIC AB 3-0 SH 27X BRD (SUTURE) ×1 IMPLANT
SUT VICRYL 0 AB UR-6 (SUTURE) ×6 IMPLANT
SUT VICRYL PLUS ABS 0 54 (SUTURE) ×3 IMPLANT
TRAY FOLEY MTR SLVR 16FR STAT (SET/KITS/TRAYS/PACK) ×3 IMPLANT
TROCAR ENDOPATH XCEL 12X100 BL (ENDOMECHANICALS) ×2 IMPLANT
TROCAR XCEL BLUNT TIP 100MML (ENDOMECHANICALS) IMPLANT
TROCAR XCEL NON-BLD 5MMX100MML (ENDOMECHANICALS) ×3 IMPLANT

## 2020-01-02 NOTE — Op Note (Signed)
Preoperative diagnosis: Acute appendicitis.  Postoperative diagnosis: Acute appendicitis  Procedure: Laparoscopic appendectomy.  Anesthesia: GETA  Surgeon: Sung Amabile  Wound Classification: clean contaminated  Specimen: Appendix  Complications: None  Estimated Blood Loss: 3 mL   Indications: Patient is a 24 y.o. female  presented with right lower quadrant pain.  Computed tomography scan and physical examination were consistent with acute appendicitis.   Findings: 1. Acutely inflamed appendix 2. No peri-appendiceal abscess or phlegmon 3. Normal anatomy 4. Appendiceal artery ligated and divided with EndoGIA 5. Adequate hemostasis.   Description of procedure: The patient was placed on the operating table in the supine position, left arm tucked. General anesthesia was induced. A time-out was completed verifying correct patient, procedure, site, positioning, and implant(s) and/or special equipment prior to beginning this procedure. A Foley catheter placed. The abdomen was prepped and draped in the usual sterile fashion.   Due to her recent C-section, patient was made to enter the abdomen via the Veress needle technique.  Veress needle inserted at Palmer's point and 2 clicks confirmed and positive saline drop test noted prior to beginning insufflation to 15 mmHg with no issues.  Once the abdomen was inflated, Optiview technique was used at the same Palmer's point and a 5 mm port was placed using direct visualization.  No injuries from initial trocar placement were noted.  Inspection of the suprapubic region noted some adhesions at the midline.  One 27mm port and 12 -mm port was then placed around these adhesions, above the symphysis pubis on midline and LLQ.  Care was taken to avoid injury to the bladder or inferior epigastric vessels. The table was placed in the Trendelenburg position with the right side elevated.  All adhesions were taken down with hook cautery prior to beginning the  originally scheduled procedure.  An inflamed appendix was identified and elevated.  Window created at base of appendix in the mesentery.   An endoscopic blue load linear cutting stapler was then used to divide and staple the base of the appendix. It was reloaded with a vascular cartridge and the mesoappendix similarly divided.  The appendix was placed in an endoscopic retrieval bag and removed.   The appendiceal stump was examined and hemostasis noted. Scant serosanguinous fluid and minimal blood suctioned out.  No other pathology was identified within pelvis.  12 mm trocar in left lower quadrant removed and port site closed with PMI using 0 vicryl under direct vision. Remaining trocars were removed under direct vision. No bleeding was noted.The abdomen was allowed to collapse. All skin incisions then closed with subcuticular sutures Monocryl 4-0.  Wounds then dressed with dermabond.  The patient tolerated the procedure well, foley removed, awakened from anesthesia and was taken to the postanesthesia care unit in satisfactory condition.  Sponge count and instrument count correct at the end of the procedure.

## 2020-01-02 NOTE — ED Notes (Signed)
Surgical consent signed, pt to go to surgery soon, scheduled for 10am.the patient informed of time.

## 2020-01-02 NOTE — Anesthesia Preprocedure Evaluation (Addendum)
Anesthesia Evaluation  Patient identified by MRN, date of birth, ID band Patient awake    Reviewed: Allergy & Precautions, H&P , NPO status , reviewed documented beta blocker date and time   History of Anesthesia Complications (+) Family history of anesthesia reaction and history of anesthetic complications  Airway Mallampati: II  TM Distance: >3 FB Neck ROM: full    Dental  (+) Teeth Intact   Pulmonary    Pulmonary exam normal        Cardiovascular Normal cardiovascular exam     Neuro/Psych    GI/Hepatic   Endo/Other    Renal/GU      Musculoskeletal   Abdominal   Peds  Hematology   Anesthesia Other Findings Lengthy discussion re PONV. It seems that postop narcotics are culprit for N/V. Will do ERAS, max anti-nausea w TIVA  Past Medical History: No date: Complication of anesthesia     Comment:  nausea and vomiting for 3 days after delivery No date: Family history of adverse reaction to anesthesia     Comment:  mom - PONV No date: Hypertrophy of tonsils and adenoids Past Surgical History: No date: CESAREAN SECTION 04/19/2019: CESAREAN SECTION; N/A     Comment:  Procedure: CESAREAN SECTION, REPEAT;  Surgeon: Ward,               Elenora Fender, MD;  Location: [facility];  Service: Obstetrics;              Laterality: N/A; 05/06/2016: TONSILLECTOMY AND ADENOIDECTOMY; N/A     Comment:  Procedure: TONSILLECTOMY AND ADENOIDECTOMY;  Surgeon:               Linus Salmons, MD;  Location: [facility];                Service: ENT;  Laterality: N/A; No date: WISDOM TOOTH EXTRACTION BMI    Body Mass Index: 29.94 kg/m     Reproductive/Obstetrics                            Anesthesia Physical Anesthesia Plan  ASA: II  Anesthesia Plan: General   Post-op Pain Management:    Induction: Intravenous  PONV Risk Score and Plan: Ondansetron, Dexamethasone, Scopolamine patch - Pre-op, Midazolam  and Treatment may vary due to age or medical condition  Airway Management Planned: Oral ETT  Additional Equipment:   Intra-op Plan:   Post-operative Plan: Extubation in OR  Informed Consent: I have reviewed the patients History and Physical, chart, labs and discussed the procedure including the risks, benefits and alternatives for the proposed anesthesia with the patient or authorized representative who has indicated his/her understanding and acceptance.     Dental Advisory Given  Plan Discussed with: CRNA  Anesthesia Plan Comments:         Anesthesia Quick Evaluation

## 2020-01-02 NOTE — Transfer of Care (Signed)
Immediate Anesthesia Transfer of Care Note  Patient: Kaitlyn Hamilton  Procedure(s) Performed: APPENDECTOMY LAPAROSCOPIC (N/A )  Patient Location: PACU  Anesthesia Type:General  Level of Consciousness: awake and alert   Airway & Oxygen Therapy: Patient Spontanous Breathing and Patient connected to face mask oxygen  Post-op Assessment: Report given to RN, Post -op Vital signs reviewed and stable and Patient moving all extremities  Post vital signs: Reviewed and stable  Last Vitals:  Vitals Value Taken Time  BP 97/56 01/02/20 1204  Temp    Pulse 95 01/02/20 1207  Resp 22 01/02/20 1207  SpO2 99 % 01/02/20 1207  Vitals shown include unvalidated device data.  Last Pain:  Vitals:   01/02/20 1203  TempSrc:   PainSc: (P) 0-No pain         Complications: No apparent anesthesia complications

## 2020-01-02 NOTE — Anesthesia Procedure Notes (Signed)
Procedure Name: Intubation Date/Time: 01/02/2020 10:41 AM Performed by: Jerrye Noble, CRNA Pre-anesthesia Checklist: Patient identified, Emergency Drugs available, Suction available and Patient being monitored Patient Re-evaluated:Patient Re-evaluated prior to induction Oxygen Delivery Method: Circle system utilized Preoxygenation: Pre-oxygenation with 100% oxygen Induction Type: IV induction Ventilation: Mask ventilation without difficulty Laryngoscope Size: Mac and 3 Grade View: Grade I Tube type: Oral Tube size: 7.0 mm Number of attempts: 1 Airway Equipment and Method: Stylet and Oral airway Placement Confirmation: ETT inserted through vocal cords under direct vision,  positive ETCO2 and breath sounds checked- equal and bilateral Tube secured with: Tape Dental Injury: Teeth and Oropharynx as per pre-operative assessment  Comments: Performed by Reece Agar, SRNA under the supervision of CRNA and MDA

## 2020-01-02 NOTE — Interval H&P Note (Signed)
History and Physical Interval Note:  01/02/2020 10:36 AM  Kaitlyn Hamilton  has presented today for surgery, with the diagnosis of acute appendicitis.  The various methods of treatment have been discussed with the patient and family. After consideration of risks, benefits and other options for treatment, the patient has consented to  Procedure(s): APPENDECTOMY LAPAROSCOPIC (N/A) as a surgical intervention.  The patient's history has been reviewed, patient examined, no change in status, stable for surgery.  I have reviewed the patient's chart and labs.  Questions were answered to the patient's satisfaction.     Demaree Liberto Tonna Boehringer

## 2020-01-02 NOTE — ED Notes (Signed)
Pt being transport to [facility].

## 2020-01-03 LAB — BASIC METABOLIC PANEL
Anion gap: 5 (ref 5–15)
BUN: 9 mg/dL (ref 6–20)
CO2: 25 mmol/L (ref 22–32)
Calcium: 8.9 mg/dL (ref 8.9–10.3)
Chloride: 110 mmol/L (ref 98–111)
Creatinine, Ser: 0.48 mg/dL (ref 0.44–1.00)
GFR calc Af Amer: >60 mL/min (ref >60)
GFR calc non Af Amer: >60 mL/min (ref >60)
Glucose, Bld: 130 mg/dL — ABNORMAL HIGH (ref 70–99)
Potassium: 3.9 mmol/L (ref 3.5–5.1)
Sodium: 140 mmol/L (ref 135–145)

## 2020-01-03 LAB — CBC
HCT: 38.8 % (ref 36.0–46.0)
Hemoglobin: 12.9 g/dL (ref 12.0–15.0)
MCH: 29.2 pg (ref 26.0–34.0)
MCHC: 33.2 g/dL (ref 30.0–36.0)
MCV: 87.8 fL (ref 80.0–100.0)
Platelets: 225 10*3/uL (ref 150–400)
RBC: 4.42 MIL/uL (ref 3.87–5.11)
RDW: 11.9 % (ref 11.5–15.5)
WBC: 11.5 10*3/uL — ABNORMAL HIGH (ref 4.0–10.5)
nRBC: 0 % (ref 0.0–0.2)

## 2020-01-03 LAB — SURGICAL PATHOLOGY

## 2020-01-03 LAB — PHOSPHORUS: Phosphorus: 3 mg/dL (ref 2.5–4.6)

## 2020-01-03 LAB — MAGNESIUM: Magnesium: 1.9 mg/dL (ref 1.7–2.4)

## 2020-01-03 MED ORDER — DOCUSATE SODIUM 100 MG PO CAPS: 100.0000 mg | ORAL_CAPSULE | Freq: Two times a day (BID) | ORAL | 0 refills | Status: AC | PRN

## 2020-01-03 MED ORDER — IBUPROFEN 800 MG PO TABS
800.0000 mg | ORAL_TABLET | Freq: Three times a day (TID) | ORAL | 0 refills | Status: DC | PRN
Start: 2020-01-03 — End: 2020-09-03

## 2020-01-03 MED ORDER — HYDROCODONE-ACETAMINOPHEN 5-325 MG PO TABS: 1.0000 | ORAL_TABLET | Freq: Four times a day (QID) | ORAL | 0 refills | Status: DC | PRN

## 2020-01-03 MED ORDER — ACETAMINOPHEN 325 MG PO TABS
650.0000 mg | ORAL_TABLET | Freq: Three times a day (TID) | ORAL | 0 refills | Status: AC | PRN
Start: 2020-01-03 — End: 2020-02-02

## 2020-01-03 NOTE — Anesthesia Postprocedure Evaluation (Signed)
Anesthesia Post Note  Patient: Kaitlyn Hamilton  Procedure(s) Performed: APPENDECTOMY LAPAROSCOPIC (N/A )  Patient location during evaluation: PACU Anesthesia Type: General Level of consciousness: awake and alert Pain management: pain level controlled Vital Signs Assessment: post-procedure vital signs reviewed and stable Respiratory status: spontaneous breathing, nonlabored ventilation, respiratory function stable and patient connected to nasal cannula oxygen Cardiovascular status: blood pressure returned to baseline and stable Postop Assessment: no apparent nausea or vomiting Anesthetic complications: no     Last Vitals:  Vitals:   01/03/20 0012 01/03/20 0352  BP: (!) 95/49 (!) 103/59  Pulse: 65 67  Resp: 17 18  Temp: 36.7 C 36.9 C  SpO2: 100% 98%    Last Pain:  Vitals:   01/03/20 0900  TempSrc:   PainSc: 0-No pain                 Cleda Mccreedy [name]

## 2020-01-03 NOTE — Plan of Care (Signed)
Discharge teaching completed with patient who verbalized understanding of teaching.  Discharged with all belongings and in stable condition.

## 2020-01-03 NOTE — Discharge Summary (Signed)
Physician Discharge Summary  Patient ID: Kaitlyn Hamilton MRN: [id number] DOB/AGE: 24-04-1996 24 y.o.  Admit date: 01/01/2020 Discharge date: 01/03/2020  Admission Diagnoses: Acute appendicitis  Discharge Diagnoses:  Same as above  Discharged Condition: good  Hospital Course: Admitted for above.  Uneventful laparoscopic appendectomy.  Please see op note for details.  Postop, recovered as expected.,  Discharge patient tolerating diet, voiding spontaneously, and pain control with oral medications.  Consults: None  Discharge Exam: Blood pressure (!) 103/59, pulse 67, temperature 98.4 F (36.9 C), temperature source Oral, resp. rate 18, height 5\' 3"  (1.6 m), weight 76.7 kg, SpO2 98 %, unknown if currently breastfeeding. General appearance: alert, cooperative and no distress GI: Soft, no guarding, focal tenderness around incision sites as expected.  Visions clean dry and intact.  Disposition:  Discharge disposition: 01-Home or Self Care       Discharge Instructions    Discharge patient   Complete by: As directed    Discharge disposition: 01-Home or Self Care   Discharge patient date: 01/03/2020     Allergies as of 01/03/2020   No Active Allergies     Medication List    TAKE these medications   acetaminophen 325 MG tablet Commonly known as: Tylenol Take 2 tablets (650 mg total) by mouth every 8 (eight) hours as needed for mild pain.   Brewers Yeast 500 MG Tabs Take 1 tablet by mouth as directed.   docusate sodium 100 MG capsule Commonly known as: Colace Take 1 capsule (100 mg total) by mouth 2 (two) times daily as needed for up to 10 days for mild constipation.   [name] 0.35 MG tablet Generic drug: norethindrone Take 1 tablet by mouth daily.   HYDROcodone-acetaminophen 5-325 MG tablet Commonly known as: Norco Take 1 tablet by mouth every 6 (six) hours as needed for up to 6 doses for moderate pain.   ibuprofen 800 MG tablet Commonly known as: ADVIL Take 1  tablet (800 mg total) by mouth every 8 (eight) hours as needed for mild pain or moderate pain.   magnesium oxide 400 MG tablet Commonly known as: MAG-OX Take 400 mg by mouth daily.   Potassium 99 MG Tabs Take 99 mg by mouth daily.   prenatal multivitamin Tabs tablet Take 1 tablet by mouth daily.      Follow-up Information    01/05/2020, DO On 01/21/2020.   Specialty: Surgery Why: post op lap appy 9am appointment Contact information: [address] [place] [phone]            Total time spent arranging discharge was >43min. Signed: 31m 01/03/2020, 12:57 PM

## 2020-01-03 NOTE — Discharge Instructions (Signed)
Laparoscopic Appendectomy, Care After This sheet gives you information about how to care for yourself after your procedure. Your doctor may also give you more specific instructions. If you have problems or questions, contact your doctor. Follow these instructions at home: Care for cuts from surgery (incisions)   Follow instructions from your doctor about how to take care of your cuts from surgery. Make sure you: ? Wash your hands with soap and water before you change your bandage (dressing). If you cannot use soap and water, use hand sanitizer. ? Change your bandage as told by your doctor. ? Leave stitches (sutures), skin glue, or skin tape (adhesive) strips in place. They may need to stay in place for 2 weeks or longer. If tape strips get loose and curl up, you may trim the loose edges. Do not remove tape strips completely unless your doctor says it is okay.  Do not take baths, swim, or use a hot tub until your doctor says it is okay. OK TO SHOWER 24HRS AFTER YOUR SURGERY.   Check your surgical cut area every day for signs of infection. Check for: ? More redness, swelling, or pain. ? More fluid or blood. ? Warmth. ? Pus or a bad smell. Activity  Do not drive or use heavy machinery while taking prescription pain medicine.  Do not play contact sports until your doctor says it is okay.  Do not drive for 24 hours if you were given a medicine to help you relax (sedative).  Rest as needed. Do not return to work or school until your doctor says it is okay. General instructions .  tylenol and advil as needed for discomfort.  Please alternate between the two every four hours as needed for pain.   .  Use narcotics, if prescribed, only when tylenol and motrin is not enough to control pain. .  325-650mg every 8hrs to max of 3000mg/24hrs (including the 325mg in every norco dose) for the tylenol.   .  Advil up to 800mg per dose every 8hrs as needed for pain.    To prevent or treat constipation  while you are taking prescription pain medicine, your doctor may recommend that you: ? Drink enough fluid to keep your pee (urine) clear or pale yellow. ? Take over-the-counter or prescription medicines. ? Eat foods that are high in fiber, such as fresh fruits and vegetables, whole grains, and beans. ? Limit foods that are high in fat and processed sugars, such as fried and sweet foods. Contact a doctor if:  You develop a rash.  You have more redness, swelling, or pain around your surgical cuts.  You have more fluid or blood coming from your surgical cuts.  Your surgical cuts feel warm to the touch.  You have pus or a bad smell coming from your surgical cuts.  You have a fever.  One or more of your surgical cuts breaks open. Get help right away if:  You have trouble breathing.  You have chest pain.  You have pain that is getting worse in your shoulders.  You faint or feel dizzy when you stand.  You have very bad pain in your belly (abdomen).  You are sick to your stomach (nauseous) for more than one day.  You have throwing up (vomiting) that lasts for more than one day.  You have leg pain. This information is not intended to replace advice given to you by your health care provider. Make sure you discuss any questions you have with your   health care provider. Document Released: 05/17/2008 Document Revised: 02/27/2016 Document Reviewed: 01/25/2016 Elsevier Interactive Patient Education  2019 Elsevier Inc.   

## 2020-09-03 ENCOUNTER — Ambulatory Visit (INDEPENDENT_AMBULATORY_CARE_PROVIDER_SITE_OTHER): Payer: 59 | Admitting: Internal Medicine

## 2020-09-03 ENCOUNTER — Other Ambulatory Visit: Payer: Self-pay

## 2020-09-03 ENCOUNTER — Encounter: Payer: Self-pay | Admitting: Internal Medicine

## 2020-09-03 VITALS — BP 108/70 | HR 48 | Temp 97.8°F | Ht 63.25 in | Wt 198.0 lb

## 2020-09-03 DIAGNOSIS — F53 Postpartum depression: Secondary | ICD-10-CM | POA: Diagnosis not present

## 2020-09-03 DIAGNOSIS — F419 Anxiety disorder, unspecified: Secondary | ICD-10-CM | POA: Insufficient documentation

## 2020-09-03 DIAGNOSIS — O99345 Other mental disorders complicating the puerperium: Secondary | ICD-10-CM | POA: Diagnosis not present

## 2020-09-03 DIAGNOSIS — H938X3 Other specified disorders of ear, bilateral: Secondary | ICD-10-CM | POA: Diagnosis not present

## 2020-09-03 NOTE — Assessment & Plan Note (Signed)
Managed on Sertraline and Trazodone as previously prescribed Referral placed to psychiatry and psychology per patient request Support offered today

## 2020-09-03 NOTE — Assessment & Plan Note (Signed)
Managed on Sertraline and Trazodone as previously prescribed Referral to psychiatry and psychology placed per patient request Support offered

## 2020-09-03 NOTE — Progress Notes (Signed)
HPI  Pt presents to the clinic today to establish care and for management of the conditions listed below. She has not had a PCP in many years but has been going to [facility].  Anxiety and Postpartum Depression: She has been working with an Scientist, clinical (histocompatibility and immunogenetics). She has also been seeing a therapist virtually. She has been taking Sertraline as prescribed. She takes Trazadone as needed for sleep. She would like referral for a therapist/psychitrist in this area.  Flu: never Tetanus: 01/2019 Covid: never Pap Smear: 2018 Dentist: as needed  Past Medical History:  Diagnosis Date  . Complication of anesthesia    nausea and vomiting for 3 days after delivery  . Family history of adverse reaction to anesthesia    mom - PONV  . Hypertrophy of tonsils and adenoids     Current Outpatient Medications  Medication Sig Dispense Refill  . AUBRA EQ 0.1-20 MG-MCG tablet Take 1 tablet by mouth at bedtime.    . sertraline (ZOLOFT) 100 MG tablet Take 50 mg by mouth daily.    . traZODone (DESYREL) 50 MG tablet      No current facility-administered medications for this visit.    No Known Allergies  Family History  Problem Relation Age of Onset  . Breast cancer Paternal Grandmother     Social History   Socioeconomic History  . Marital status: Married    Spouse name: Not on file  . Number of children: Not on file  . Years of education: Not on file  . Highest education level: Not on file  Occupational History  . Not on file  Tobacco Use  . Smoking status: Never Smoker  . Smokeless tobacco: Never Used  Vaping Use  . Vaping Use: Never used  Substance and Sexual Activity  . Alcohol use: No  . Drug use: No  . Sexual activity: Yes    Birth control/protection: Pill  Other Topics Concern  . Not on file  Social History Narrative  . Not on file   Social Determinants of Health   Financial Resource Strain: Not on file  Food Insecurity: Not on file  Transportation Needs: Not on file   Physical Activity: Not on file  Stress: Not on file  Social Connections: Not on file  Intimate Partner Violence: Not on file    ROS:  Constitutional: Denies fever, malaise, fatigue, headache or abrupt weight changes.  HEENT: Pt reports bilateral ear congestion. Denies eye pain, eye redness, ear pain, ringing in the ears, wax buildup, runny nose, nasal congestion, bloody nose, or sore throat. Respiratory: Denies difficulty breathing, shortness of breath, cough or sputum production.   Cardiovascular: Denies chest pain, chest tightness, palpitations or swelling in the hands or feet.  Gastrointestinal: Denies abdominal pain, bloating, constipation, diarrhea or blood in the stool.  GU: Denies frequency, urgency, pain with urination, blood in urine, odor or discharge. Musculoskeletal: Denies decrease in range of motion, difficulty with gait, muscle pain or joint pain and swelling.  Skin: Denies redness, rashes, lesions or ulcercations.  Neurological: Denies dizziness, difficulty with memory, difficulty with speech or problems with balance and coordination.  Psych: Pt has a history of anxiety and depression. Denies SI/HI.  No other specific complaints in a complete review of systems (except as listed in HPI above).  PE:  BP 108/70   Pulse (!) 48   Temp 97.8 F (36.6 C) (Temporal)   Ht 5' 3.25" (1.607 m)   Wt 198 lb (89.8 kg)   LMP 08/24/2020  SpO2 98%   BMI 34.80 kg/m  Wt Readings from Last 3 Encounters:  09/03/20 198 lb (89.8 kg)  01/01/20 169 lb (76.7 kg)  04/14/19 197 lb 15.6 oz (89.8 kg)    General: Appears her stated age, obese in NAD. HEENT: Head: normal shape and size; Eyes: sclera white, no icterus, conjunctiva pink, PERRLA and EOMs intact; Ears: Tm's gray and intact, normal light reflex; Cardiovascular: Bradycardic with normal rhythm. S1,S2 noted.  No murmur, rubs or gallops noted.  Pulmonary/Chest: Normal effort and positive vesicular breath sounds. No respiratory  distress. No wheezes, rales or ronchi noted.  Neurological: Alert and oriented.  Psychiatric: Mood and affect normal. Behavior is normal. Judgment and thought content normal.     BMET    Component Value Date/Time   NA 140 01/03/2020 0435   K 3.9 01/03/2020 0435   CL 110 01/03/2020 0435   CO2 25 01/03/2020 0435   GLUCOSE 130 (H) 01/03/2020 0435   BUN 9 01/03/2020 0435   CREATININE 0.48 01/03/2020 0435   CALCIUM 8.9 01/03/2020 0435   GFRNONAA >60 01/03/2020 0435   GFRAA >60 01/03/2020 0435    Lipid Panel  No results found for: CHOL, TRIG, HDL, CHOLHDL, VLDL, LDLCALC  CBC    Component Value Date/Time   WBC 11.5 (H) 01/03/2020 0435   RBC 4.42 01/03/2020 0435   HGB 12.9 01/03/2020 0435   HGB 13.4 08/30/2012 1504   HCT 38.8 01/03/2020 0435   HCT 32.2 (L) 08/31/2012 0553   PLT 225 01/03/2020 0435   PLT 164 08/30/2012 1504   MCV 87.8 01/03/2020 0435   MCV 86 08/30/2012 1504   MCH 29.2 01/03/2020 0435   MCHC 33.2 01/03/2020 0435   RDW 11.9 01/03/2020 0435   RDW 13.6 08/30/2012 1504   LYMPHSABS 1.4 08/30/2012 1504   MONOABS 0.8 08/30/2012 1504   EOSABS 0.0 08/30/2012 1504   BASOSABS 0.1 08/30/2012 1504    Hgb A1C No results found for: HGBA1C   Assessment and Plan:  Ear Congestion:  Start Flonase daily  Make an appt for her annual exam Nicki Reaper, NP This visit occurred during the SARS-CoV-2 public health emergency.  Safety protocols were in place, including screening questions prior to the visit, additional usage of staff PPE, and extensive cleaning of exam room while observing appropriate contact time as indicated for disinfecting solutions.

## 2020-09-03 NOTE — Patient Instructions (Signed)

## 2020-09-10 ENCOUNTER — Ambulatory Visit (INDEPENDENT_AMBULATORY_CARE_PROVIDER_SITE_OTHER): Payer: 59 | Admitting: Psychology

## 2020-09-10 DIAGNOSIS — F411 Generalized anxiety disorder: Secondary | ICD-10-CM | POA: Diagnosis not present

## 2020-09-25 ENCOUNTER — Ambulatory Visit: Payer: 59 | Admitting: Psychology

## 2020-10-09 ENCOUNTER — Ambulatory Visit: Payer: 59 | Admitting: Psychology

## 2020-10-27 ENCOUNTER — Encounter: Payer: No Typology Code available for payment source | Admitting: Internal Medicine

## 2021-01-26 ENCOUNTER — Ambulatory Visit (INDEPENDENT_AMBULATORY_CARE_PROVIDER_SITE_OTHER): Payer: 59

## 2021-01-26 ENCOUNTER — Ambulatory Visit
Admission: RE | Admit: 2021-01-26 | Discharge: 2021-01-26 | Disposition: A | Discharge status: Home / Self Care | Payer: 59 | Source: Ambulatory Visit

## 2021-01-26 ENCOUNTER — Other Ambulatory Visit: Payer: Self-pay

## 2021-01-26 VITALS — BP 106/63 | HR 73 | Temp 98.7°F | Resp 14 | Ht 63.0 in | Wt 190.0 lb

## 2021-01-26 DIAGNOSIS — S93401A Sprain of unspecified ligament of right ankle, initial encounter: Secondary | ICD-10-CM

## 2021-01-26 DIAGNOSIS — M25571 Pain in right ankle and joints of right foot: Secondary | ICD-10-CM

## 2021-01-26 NOTE — Discharge Instructions (Addendum)
SPRAIN: Stressed avoiding painful activities . Reviewed RICE guidelines. Use medications as directed, including NSAIDs. If no NSAIDs have been prescribed for you today, you may take Aleve or Motrin over the counter. May use Tylenol in between doses of NSAIDs.  If no improvement in the next 1-2 weeks, f/u with PCP or return to our office for reexamination, and please feel free to call or return at any time for any questions or concerns you may have and we will be happy to help you!     

## 2021-01-26 NOTE — ED Triage Notes (Signed)
Patient states that she fell down her porch stairs yesterday afternoon.  Patient states that she fell down 3 steps and injured her right ankle.  Patient c/o pain in her right ankle.

## 2021-01-26 NOTE — ED Provider Notes (Signed)
[facility]    CSN: [id number] Arrival date & time: 01/26/21  1548      History   Chief Complaint Chief Complaint  Patient presents with  . Ankle Pain    right    HPI Kaitlyn Hamilton is a 25 y.o. female presenting for right ankle pain and swelling as well as bruising following a fall down 3 stairs yesterday.  Patient says that she actually fell.  She says she has increased pain when she bears weight.  Most of the pains of the lateral ankle and the hindfoot.  She denies any numbness or tingling or weakness.  She does she can bear weight but it is painful.  She has taken Tylenol and ice the area.  She also has a brace on her ankle currently.  Patient denies any history of ankle sprains and has never fractured this extremity before.  She did not sustain any other injuries in the fall.  No other complaints.  HPI  Past Medical History:  Diagnosis Date  . Complication of anesthesia    nausea and vomiting for 3 days after delivery  . Family history of adverse reaction to anesthesia    mom - PONV  . Hypertrophy of tonsils and adenoids     Patient Active Problem List   Diagnosis Date Noted  . Postpartum depression 09/03/2020  . Anxiety 09/03/2020    Past Surgical History:  Procedure Laterality Date  . CESAREAN SECTION    . CESAREAN SECTION N/A 04/19/2019   Procedure: CESAREAN SECTION, REPEAT;  Surgeon: Ward, Elenora Fender, MD;  Location: [facility];  Service: Obstetrics;  Laterality: N/A;  . LAPAROSCOPIC APPENDECTOMY N/A 01/02/2020   Procedure: APPENDECTOMY LAPAROSCOPIC;  Surgeon: Sung Amabile, DO;  Location: [facility];  Service: General;  Laterality: N/A;  . TONSILLECTOMY AND ADENOIDECTOMY N/A 05/06/2016   Procedure: TONSILLECTOMY AND ADENOIDECTOMY;  Surgeon: Linus Salmons, MD;  Location: [facility];  Service: ENT;  Laterality: N/A;  . WISDOM TOOTH EXTRACTION      OB History    Gravida  2   Para  1   Term  1   Preterm      AB      Living  1      SAB      IAB      Ectopic      Multiple      Live Births  1            Home Medications    Prior to Admission medications   Medication Sig Start Date End Date Taking? Authorizing Provider  ADDERALL XR 20 MG 24 hr capsule Take 20 mg by mouth daily. 01/04/21  Yes [provider]  norethindrone-ethinyl estradiol (LOESTRIN) 1-20 MG-MCG tablet Take 1 tablet by mouth daily. 12/29/20  Yes [provider]  AUBRA EQ 0.1-20 MG-MCG tablet Take 1 tablet by mouth at bedtime. 08/31/20   [provider]  sertraline (ZOLOFT) 100 MG tablet Take 50 mg by mouth daily. 08/19/20   [provider]  traZODone (DESYREL) 50 MG tablet  08/19/20   [provider]    Family History Family History  Problem Relation Age of Onset  . Breast cancer Paternal Grandmother     Social History Social History   Tobacco Use  . Smoking status: Never Smoker  . Smokeless tobacco: Never Used  Vaping Use  . Vaping Use: Never used  Substance Use Topics  . Alcohol use: No  . Drug use: No  Allergies   Patient has no known allergies.   Review of Systems Review of Systems  Musculoskeletal: Positive for arthralgias, gait problem and joint swelling.  Skin: Positive for color change. Negative for wound.  Neurological: Negative for weakness and numbness.     Physical Exam Triage Vital Signs ED Triage Vitals  Enc Vitals Group     BP 01/26/21 1615 106/63     Pulse Rate 01/26/21 1615 73     Resp 01/26/21 1615 14     Temp 01/26/21 1615 98.7 F (37.1 C)     Temp Source 01/26/21 1615 Oral     SpO2 01/26/21 1615 100 %     Weight 01/26/21 1613 190 lb (86.2 kg)     Height 01/26/21 1613 5\' 3"  (1.6 m)     Head Circumference --      Peak Flow --      Pain Score 01/26/21 1613 5     Pain Loc --      Pain Edu? --      Excl. in GC? --    No data found.  Updated Vital Signs BP 106/63 (BP Location: Left Arm)   Pulse 73   Temp 98.7 F (37.1 C) (Oral)   Resp 14    Ht 5\' 3"  (1.6 m)   Wt 190 lb (86.2 kg)   LMP 01/22/2021 (Approximate)   SpO2 100%   Breastfeeding No   BMI 33.66 kg/m       Physical Exam Vitals and nursing note reviewed.  Constitutional:      General: She is not in acute distress.    Appearance: Normal appearance. She is not ill-appearing or toxic-appearing.  HENT:     Head: Normocephalic and atraumatic.  Eyes:     General: No scleral icterus.       Right eye: No discharge.        Left eye: No discharge.     Conjunctiva/sclera: Conjunctivae normal.  Cardiovascular:     Rate and Rhythm: Normal rate and regular rhythm.     Heart sounds: Normal heart sounds.  Pulmonary:     Effort: Pulmonary effort is normal. No respiratory distress.     Breath sounds: Normal breath sounds.  Musculoskeletal:     Cervical back: Neck supple.     Right ankle: Swelling (moderate swelling lateral ankle and hindfoot) and ecchymosis (mild ecchymosis lateral ankle) present. Tenderness present over the lateral malleolus and ATF ligament. Decreased range of motion. Normal pulse.     Comments: Also TTP over talus  Skin:    General: Skin is dry.  Neurological:     General: No focal deficit present.     Mental Status: She is alert. Mental status is at baseline.     Motor: No weakness.     Gait: Gait abnormal.  Psychiatric:        Mood and Affect: Mood normal.        Behavior: Behavior normal.        Thought Content: Thought content normal.      UC Treatments / Results  Labs (all labs ordered are listed, but only abnormal results are displayed) Labs Reviewed - No data to display  EKG   Radiology DG Ankle Complete Right  Result Date: 01/26/2021 CLINICAL DATA:  Right ankle pain post fall EXAM: RIGHT ANKLE - COMPLETE 3+ VIEW COMPARISON:  None FINDINGS: Lateral soft tissue swelling. Osseous mineralization normal. Joint spaces preserved. No acute fracture, dislocation, or bone destruction. IMPRESSION: No acute  osseous abnormalities.  Electronically Signed   By: Ulyses Southward M.D.   On: 01/26/2021 17:04    Procedures Procedures (including critical care time)  Medications Ordered in UC Medications - No data to display  Initial Impression / Assessment and Plan / UC Course  I have reviewed the triage vital signs and the nursing notes.  Pertinent labs & imaging results that were available during my care of the patient were reviewed by me and considered in my medical decision making (see chart for details).    25 year old female presenting for right ankle swelling, pain and bruising following an accidental fall yesterday.  X-ray of right ankle ordered.  Concern for possible underlying fracture due to tenderness over the lateral malleolus and talus.  X-ray is negative for any fractures.  Reviewed this result with patient.  Advised her that she has an ankle sprain.  Encourage supportive care with RICE and Tylenol/ibuprofen for pain.  Patient given lace up ankle brace.  Work note provided.  Final Clinical Impressions(s) / UC Diagnoses   Final diagnoses:  Sprain of right ankle, unspecified ligament, initial encounter     Discharge Instructions     SPRAIN: Stressed avoiding painful activities . Reviewed RICE guidelines. Use medications as directed, including NSAIDs. If no NSAIDs have been prescribed for you today, you may take Aleve or Motrin over the counter. May use Tylenol in between doses of NSAIDs.  If no improvement in the next 1-2 weeks, f/u with PCP or return to our office for reexamination, and please feel free to call or return at any time for any questions or concerns you may have and we will be happy to help you!        ED Prescriptions    None     PDMP not reviewed this encounter.   Shirlee Latch, PA-C 01/26/21 1726

## 2021-04-21 IMAGING — CT CT ABD-PELV W/ CM
2 of 4 series · 15 of 46 positions shown, 17 images · IV contrast (APPLIED)
Comparison: None.

CLINICAL DATA: Right lower quadrant abdominal pain for 1 day

EXAM:
CT ABDOMEN AND PELVIS WITH CONTRAST
TECHNIQUE: Multidetector CT imaging of the abdomen and pelvis was performed
using the standard protocol following bolus administration of
intravenous contrast.
CONTRAST:  100mL OMNIPAQUE IOHEXOL 300 MG/ML  SOLN

[Series 2: routine abd/pel with · axial · 0.69mm/px · z∈[-498,-58]mm · 12 of 98 slices shown, 14 images]
[im 5/98  soft-tissue]
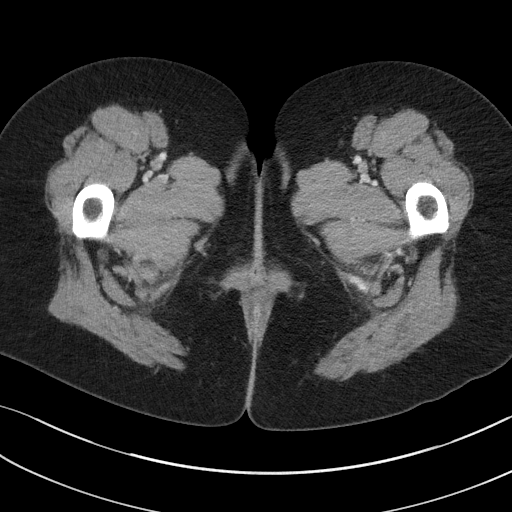
[im 5/98  bone]
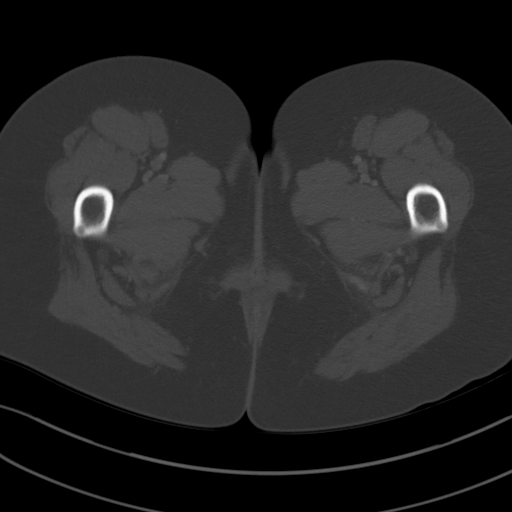
[im 14/98  soft-tissue]
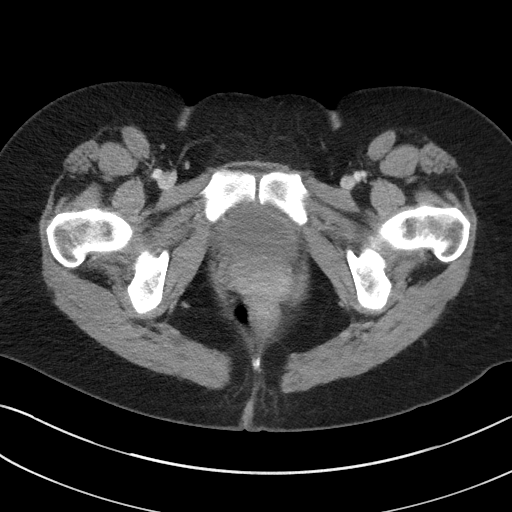
[im 23/98  soft-tissue]
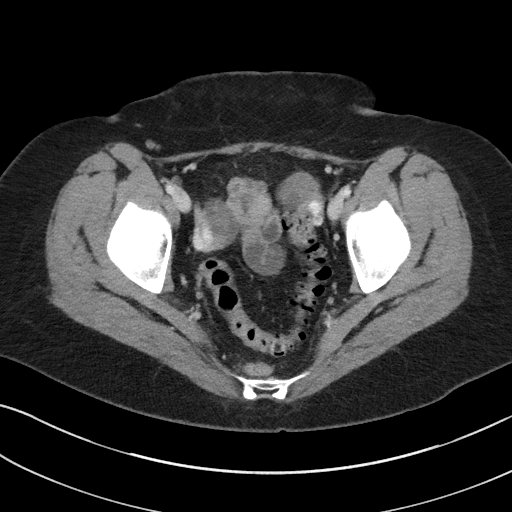
[im 31/98  soft-tissue]
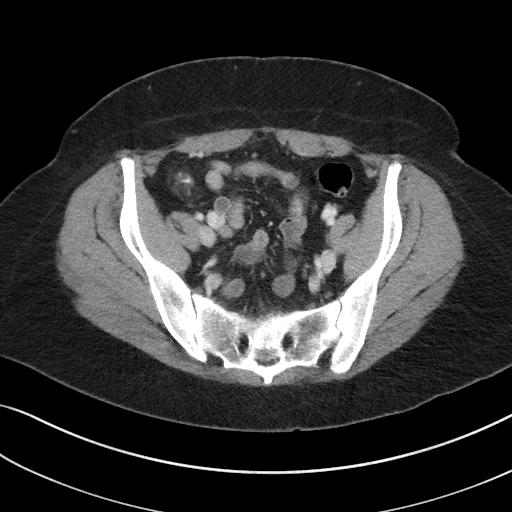
[im 36/98  soft-tissue]
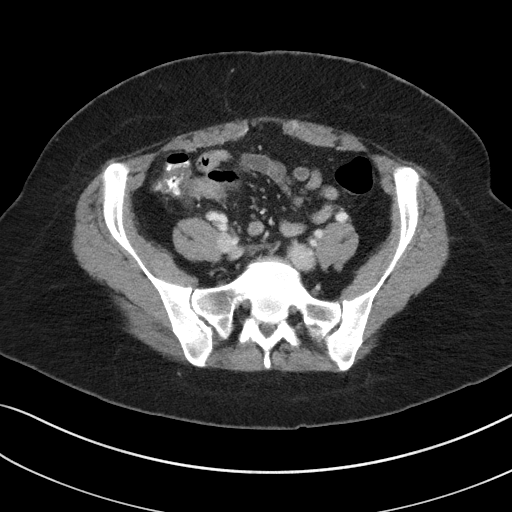
[im 45/98  soft-tissue]
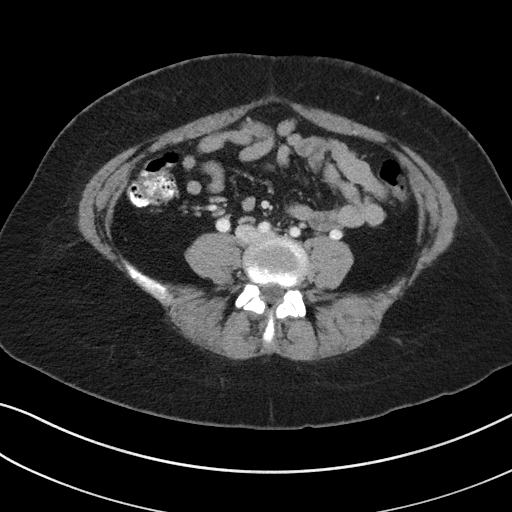
[im 53/98  soft-tissue]
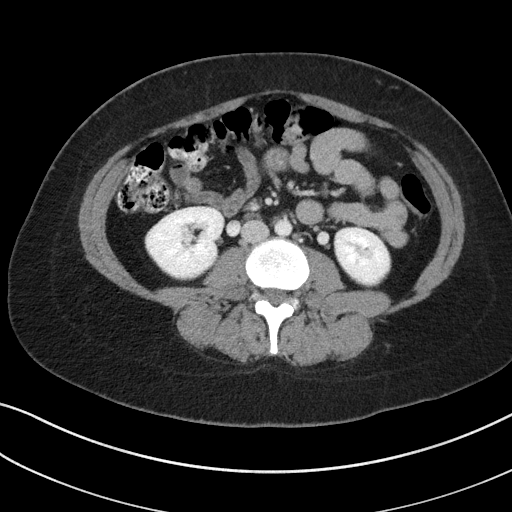
[im 62/98  soft-tissue]
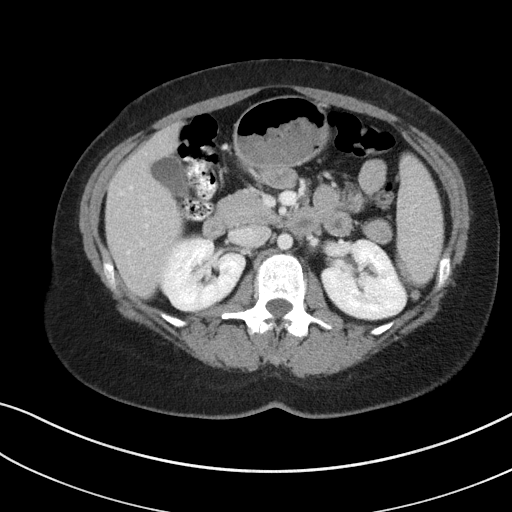
[im 67/98  soft-tissue]
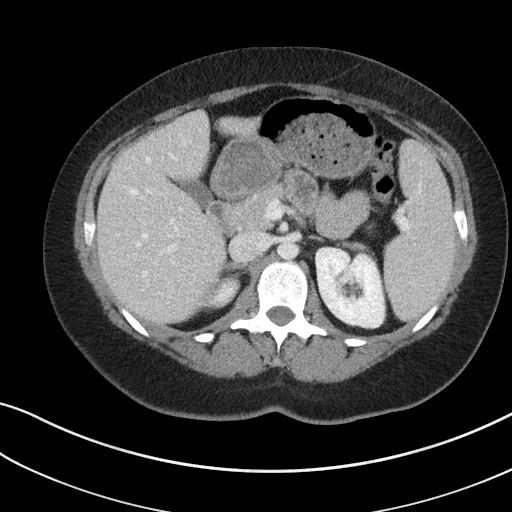
[im 67/98  bone]
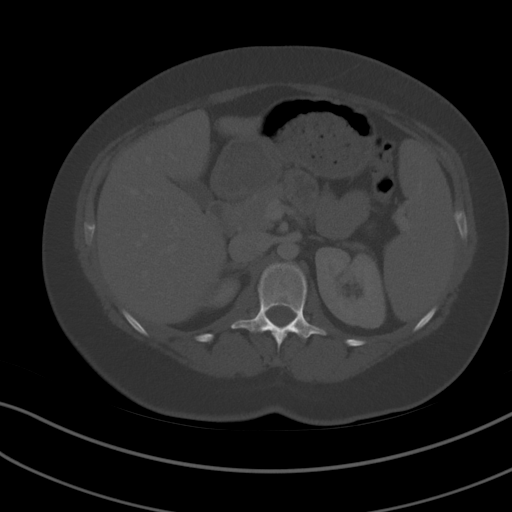
[im 75/98  soft-tissue]
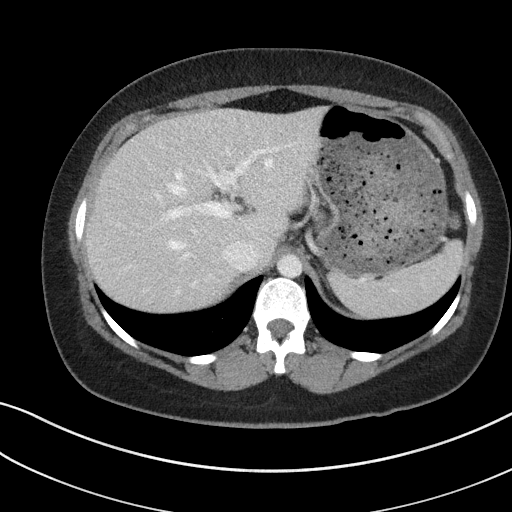
[im 84/98  soft-tissue]
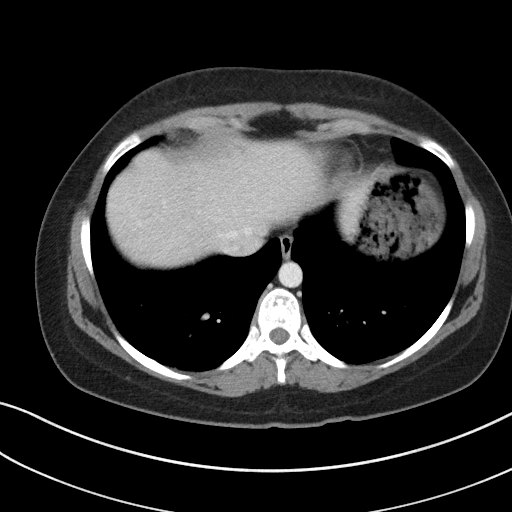
[im 93/98  soft-tissue]
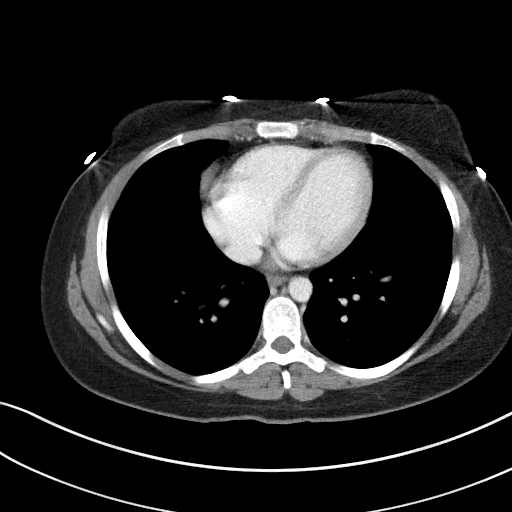

[Series 5: coronal st · coronal · 0.73mm/px · 3 of 91 slices shown]
[im 31/91  soft-tissue]
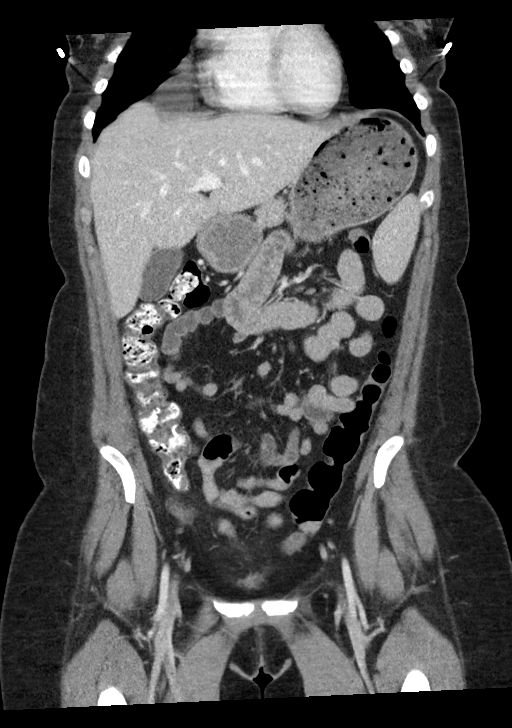
[im 41/91  soft-tissue]
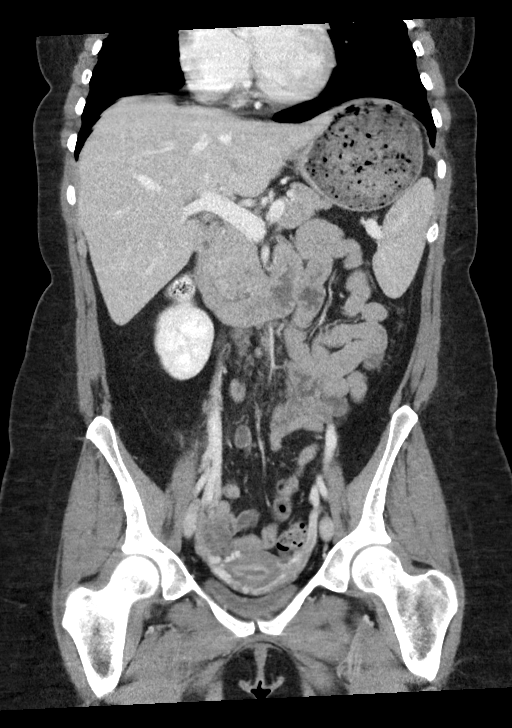
[im 51/91  soft-tissue]
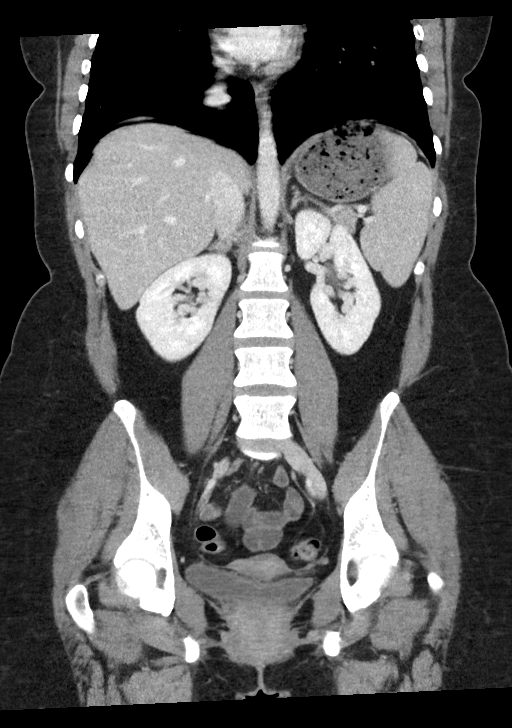

[15 of 46 positions shown; findings below may reference images not displayed]

FINDINGS: Lower chest: Ovoid fluid density 3.4 x 3.4 cm structure adjacent to
the pericardium at the anterior right lung base (series 5, image
29), which may represent a pericardial cyst. Lung bases are clear.

Hepatobiliary: No focal liver abnormality is seen. No gallstones,
gallbladder wall thickening, or biliary dilatation.

Pancreas: Unremarkable. No pancreatic ductal dilatation or
surrounding inflammatory changes.

Spleen: Normal in size without focal abnormality.

Adrenals/Urinary Tract: Adrenal glands are unremarkable. Kidneys are
normal, without renal calculi, focal lesion, or hydronephrosis.
Bladder is unremarkable for the degree of distension.

Stomach/Bowel: The appendix is dilated with thickened, enhancing
walls and surrounding periappendiceal fat stranding (series 5,
images 33-38). No adjacent fluid collection, abscess, or evidence of
perforation. Stomach and small bowel are unremarkable. No evidence
of bowel obstruction. No focal colonic thickening.

Vascular/Lymphatic: No significant vascular findings are present. No
enlarged abdominal or pelvic lymph nodes.

Reproductive: Uterus and bilateral adnexa are unremarkable.

Other: No abdominal wall hernia. No pneumoperitoneum.

Musculoskeletal: No acute or significant osseous findings.
IMPRESSION: 1. Acute uncomplicated appendicitis.
2. Ovoid fluid density structure adjacent to the pericardium at the
anterior right lung base measuring 3.4 cm, which may represent a
pericardial cyst. Finding is likely incidental. If there are
symptoms attributable to this lesion, a cardiothoracic surgical
consultation should be considered.

These results were called by telephone at the time of interpretation
on 01/01/2020 at [DATE] to provider CERNEL GERMISHUIZEN , who verbally
acknowledged these results.

## 2021-07-16 ENCOUNTER — Other Ambulatory Visit: Payer: Self-pay

## 2021-07-16 ENCOUNTER — Ambulatory Visit
Admission: RE | Admit: 2021-07-16 | Discharge: 2021-07-16 | Disposition: A | Discharge status: Home / Self Care | Payer: 59 | Source: Ambulatory Visit | Attending: Internal Medicine | Admitting: Internal Medicine

## 2021-07-16 VITALS — BP 133/89 | HR 95 | Temp 98.9°F | Resp 16

## 2021-07-16 DIAGNOSIS — R197 Diarrhea, unspecified: Secondary | ICD-10-CM | POA: Insufficient documentation

## 2021-07-16 DIAGNOSIS — K219 Gastro-esophageal reflux disease without esophagitis: Secondary | ICD-10-CM

## 2021-07-16 DIAGNOSIS — Z20822 Contact with and (suspected) exposure to covid-19: Secondary | ICD-10-CM | POA: Insufficient documentation

## 2021-07-16 DIAGNOSIS — H6691 Otitis media, unspecified, right ear: Secondary | ICD-10-CM | POA: Diagnosis not present

## 2021-07-16 DIAGNOSIS — J069 Acute upper respiratory infection, unspecified: Secondary | ICD-10-CM | POA: Insufficient documentation

## 2021-07-16 MED ORDER — FAMOTIDINE 20 MG PO TABS: 20.0000 mg | ORAL_TABLET | Freq: Two times a day (BID) | ORAL | 0 refills | Status: AC

## 2021-07-16 MED ORDER — AMOXICILLIN 875 MG PO TABS: 875.0000 mg | ORAL_TABLET | Freq: Two times a day (BID) | ORAL | 0 refills | Status: DC

## 2021-07-16 NOTE — ED Provider Notes (Addendum)
[facility]    CSN: [id number] Arrival date & time: 07/16/21  1455      History   Chief Complaint Chief Complaint  Patient presents with   Diarrhea   Chills   Cough   Nasal Congestion    HPI Kaitlyn Hamilton is a 25 y.o. female onset of diarrhea 1 week ago which is watery and happens after eating. Denies blood, and does not recall if she ate something bad.   Denies being out the country or being on any antibiotic in the past month.    2- Onset of ST 3 days ago, then later on ear pressure and post nasal drainage.   Has not had a fever.  Has been sweating  Has been fatigued.  Has not had much of an appetite.  Had neg Covid test this am.    Past Medical History:  Diagnosis Date   Complication of anesthesia    nausea and vomiting for 3 days after delivery   Family history of adverse reaction to anesthesia    mom - PONV   Hypertrophy of tonsils and adenoids     Patient Active Problem List   Diagnosis Date Noted   Postpartum depression 09/03/2020   Anxiety 09/03/2020    Past Surgical History:  Procedure Laterality Date   CESAREAN SECTION     CESAREAN SECTION N/A 04/19/2019   Procedure: CESAREAN SECTION, REPEAT;  Surgeon: Ward, Elenora Fender, MD;  Location: [facility];  Service: Obstetrics;  Laterality: N/A;   LAPAROSCOPIC APPENDECTOMY N/A 01/02/2020   Procedure: APPENDECTOMY LAPAROSCOPIC;  Surgeon: Sung Amabile, DO;  Location: [facility];  Service: General;  Laterality: N/A;   TONSILLECTOMY AND ADENOIDECTOMY N/A 05/06/2016   Procedure: TONSILLECTOMY AND ADENOIDECTOMY;  Surgeon: Linus Salmons, MD;  Location: [facility];  Service: ENT;  Laterality: N/A;   WISDOM TOOTH EXTRACTION      OB History     Gravida  2   Para  1   Term  1   Preterm      AB      Living  1      SAB      IAB      Ectopic      Multiple      Live Births  1            Home Medications    Prior to Admission medications   Medication Sig Start Date  End Date Taking? Authorizing Provider  amoxicillin (AMOXIL) 875 MG tablet Take 1 tablet (875 mg total) by mouth 2 (two) times daily. 07/16/21  Yes Rodriguez-Southworth, Nettie Elm, PA-C  famotidine (PEPCID) 20 MG tablet Take 1 tablet (20 mg total) by mouth 2 (two) times daily. 07/16/21  Yes Rodriguez-Southworth, Nettie Elm, PA-C  ADDERALL XR 20 MG 24 hr capsule Take 20 mg by mouth daily. 01/04/21   [provider]  AUBRA EQ 0.1-20 MG-MCG tablet Take 1 tablet by mouth at bedtime. 08/31/20   [provider]  norethindrone-ethinyl estradiol (LOESTRIN) 1-20 MG-MCG tablet Take 1 tablet by mouth daily. 12/29/20   [provider]  sertraline (ZOLOFT) 100 MG tablet Take 50 mg by mouth daily. 08/19/20   [provider]  traZODone (DESYREL) 50 MG tablet  08/19/20   [provider]    Family History Family History  Problem Relation Age of Onset   Breast cancer Paternal Grandmother     Social History Social History   Tobacco Use   Smoking status: Never   Smokeless  tobacco: Never  Vaping Use   Vaping Use: Never used  Substance Use Topics   Alcohol use: No   Drug use: No     Allergies   Patient has no known allergies.   Review of Systems Review of Systems  Constitutional:  Positive for appetite change, diaphoresis and fatigue. Negative for fever.  HENT:  Positive for congestion, ear pain and rhinorrhea. Negative for ear discharge and sore throat.   Respiratory:  Positive for cough.   Gastrointestinal:  Positive for diarrhea. Negative for abdominal pain and blood in stool.  Musculoskeletal:  Negative for myalgias.  Skin:  Negative for rash.  Neurological:  Negative for headaches.  Hematological:  Negative for adenopathy.    Physical Exam Triage Vital Signs ED Triage Vitals  Enc Vitals Group     BP 07/16/21 1544 133/89     Pulse Rate 07/16/21 1544 95     Resp 07/16/21 1544 16     Temp 07/16/21 1544 98.9 F (37.2 C)     Temp Source 07/16/21 1544  Oral     SpO2 07/16/21 1544 97 %     Weight --      Height --      Head Circumference --      Peak Flow --      Pain Score 07/16/21 1542 0     Pain Loc --      Pain Edu? --      Excl. in GC? --    No data found.  Updated Vital Signs BP 133/89 (BP Location: Left Arm)   Pulse 95   Temp 98.9 F (37.2 C) (Oral)   Resp 16   LMP 06/15/2021 (Approximate) Comment: 1 month ago  SpO2 97%   Visual Acuity Right Eye Distance:   Left Eye Distance:   Bilateral Distance:    Right Eye Near:   Left Eye Near:    Bilateral Near:     Physical Exam Physical Exam Vitals signs and nursing note reviewed.  Constitutional:      General: She is not in acute distress.    Appearance: Normal appearance. She is not ill-appearing, toxic-appearing or diaphoretic.  HENT:     Head: Normocephalic.     Right Ear: Tympanic membrane- red and flat with yellow matter behind it,  but ear canal and external ear normal.     Left Ear: Tympanic membrane, ear canal and external ear normal.     Nose: clear mucous.     Mouth/Throat: clear    Mouth: Mucous membranes are moist.  Eyes:     General: No scleral icterus.       Right eye: No discharge.        Left eye: No discharge.     Conjunctiva/sclera: Conjunctivae normal.  Neck:     Musculoskeletal: Neck supple. No neck rigidity.  Cardiovascular:     Rate and Rhythm: Normal rate and regular rhythm.     Heart sounds: No murmur.  Pulmonary:     Effort: Pulmonary effort is normal.     Breath sounds: Normal breath sounds.  Abdominal:     General: Bowel sounds are normal. There is no distension.     Palpations: Abdomen is soft. There is no mass.     Tenderness: has mild tenderness on epigastric region.There is no guarding or rebound.     Hernia: No hernia is present.  Musculoskeletal: Normal range of motion.  Lymphadenopathy:     Cervical: No cervical adenopathy.  Skin:  General: Skin is warm and dry.     Coloration: Skin is not jaundiced.     Findings:  No rash.  Neurological:     Mental Status: She is alert and oriented to person, place, and time.     Gait: Gait normal.  Psychiatric:        Mood and Affect: Mood normal.        Behavior: Behavior normal.        Thought Content: Thought content normal.        Judgment: Judgment normal.    UC Treatments / Results  Labs (all labs ordered are listed, but only abnormal results are displayed) Labs Reviewed  SARS CORONAVIRUS 2 (TAT 6-24 HRS)    EKG   Radiology No results found.  Procedures Procedures (including critical care time)  Medications Ordered in UC Medications - No data to display  Initial Impression / Assessment and Plan / UC Course  I have reviewed the triage vital signs and the nursing notes. Covid test pending.  GI panel and Hy pylori  ordered  and we will inform her of the results when back.  Has diarrhea, R OM, URI, GERD. I placed her on Pepsid for GERD and advised to avoid eating before bed I placed her on Amoxicillin for R OM May continue the  Flonase and Mucinex.   Covid test is pending.     Final Clinical Impressions(s) / UC Diagnoses   Final diagnoses:  Acute otitis media, right  Upper respiratory tract infection, unspecified type  Diarrhea, unspecified type  Gastroesophageal reflux disease without esophagitis   Discharge Instructions   None    ED Prescriptions     Medication Sig Dispense Auth. Provider   amoxicillin (AMOXIL) 875 MG tablet Take 1 tablet (875 mg total) by mouth 2 (two) times daily. 14 tablet Rodriguez-Southworth, Nettie Elm, PA-C   famotidine (PEPCID) 20 MG tablet Take 1 tablet (20 mg total) by mouth 2 (two) times daily. 30 tablet Rodriguez-Southworth, Nettie Elm, PA-C      PDMP not reviewed this encounter.   Garey Ham, PA-C 07/16/21 1619    Rodriguez-Southworth, Nettie Elm, PA-C 07/16/21 1621

## 2021-07-16 NOTE — ED Triage Notes (Signed)
Patient presents to Urgent Care with complaints of diarrhea, chills, cough, and nasal congestion x 7 days. Treating symptoms with mucinex.   Denies fever.

## 2021-07-17 LAB — SARS CORONAVIRUS 2 (TAT 6-24 HRS): SARS Coronavirus 2: NEGATIVE

## 2021-11-16 ENCOUNTER — Other Ambulatory Visit: Payer: Self-pay

## 2021-11-16 ENCOUNTER — Other Ambulatory Visit
Admission: RE | Admit: 2021-11-16 | Discharge: 2021-11-16 | Disposition: A | Discharge status: Home / Self Care | Payer: No Typology Code available for payment source | Attending: Gastroenterology | Admitting: Gastroenterology

## 2021-11-16 ENCOUNTER — Encounter: Payer: Self-pay | Admitting: Gastroenterology

## 2021-11-16 ENCOUNTER — Ambulatory Visit (INDEPENDENT_AMBULATORY_CARE_PROVIDER_SITE_OTHER): Payer: Self-pay | Admitting: Gastroenterology

## 2021-11-16 VITALS — BP 118/75 | HR 101 | Temp 98.1°F | Ht 63.0 in | Wt 220.2 lb

## 2021-11-16 DIAGNOSIS — K529 Noninfective gastroenteritis and colitis, unspecified: Secondary | ICD-10-CM

## 2021-11-16 DIAGNOSIS — O26849 Uterine size-date discrepancy, unspecified trimester: Secondary | ICD-10-CM | POA: Insufficient documentation

## 2021-11-16 DIAGNOSIS — E538 Deficiency of other specified B group vitamins: Secondary | ICD-10-CM | POA: Insufficient documentation

## 2021-11-16 DIAGNOSIS — R1013 Epigastric pain: Secondary | ICD-10-CM | POA: Insufficient documentation

## 2021-11-16 DIAGNOSIS — K219 Gastro-esophageal reflux disease without esophagitis: Secondary | ICD-10-CM

## 2021-11-16 NOTE — Patient Instructions (Signed)
Gastroesophageal Reflux Disease, Adult Gastroesophageal reflux (GER) happens when acid from the stomach flows up into the tube that connects the mouth and the stomach (esophagus). Normally, food travels down the esophagus and stays in the stomach to be digested. With GER, food and stomach acid sometimes move back up into the esophagus. You may have a disease called gastroesophageal reflux disease (GERD) if the reflux: Happens often. Causes frequent or very bad symptoms. Causes problems such as damage to the esophagus. When this happens, the esophagus becomes sore and swollen. Over time, GERD can make small holes (ulcers) in the lining of the esophagus. What are the causes? This condition is caused by a problem with the muscle between the esophagus and the stomach. When this muscle is weak or not normal, it does not close properly to keep food and acid from coming back up from the stomach. The muscle can be weak because of: Tobacco use. Pregnancy. Having a certain type of hernia (hiatal hernia). Alcohol use. Certain foods and drinks, such as coffee, chocolate, onions, and peppermint. What increases the risk? Being overweight. Having a disease that affects your connective tissue. Taking NSAIDs, such a ibuprofen. What are the signs or symptoms? Heartburn. Difficult or painful swallowing. The feeling of having a lump in the throat. A bitter taste in the mouth. Bad breath. Having a lot of saliva. Having an upset or bloated stomach. Burping. Chest pain. Different conditions can cause chest pain. Make sure you see your doctor if you have chest pain. Shortness of breath or wheezing. A long-term cough or a cough at night. Wearing away of the surface of teeth (tooth enamel). Weight loss. How is this treated? Making changes to your diet. Taking medicine. Having surgery. Treatment will depend on how bad your symptoms are. Follow these instructions at home: Eating and drinking  Follow a  diet as told by your doctor. You may need to avoid foods and drinks such as: Coffee and tea, with or without caffeine. Drinks that contain alcohol. Energy drinks and sports drinks. Bubbly (carbonated) drinks or sodas. Chocolate and cocoa. Peppermint and mint flavorings. Garlic and onions. Horseradish. Spicy and acidic foods. These include peppers, chili powder, curry powder, vinegar, hot sauces, and BBQ sauce. Citrus fruit juices and citrus fruits, such as oranges, lemons, and limes. Tomato-based foods. These include red sauce, chili, salsa, and pizza with red sauce. Fried and fatty foods. These include donuts, french fries, potato chips, and high-fat dressings. High-fat meats. These include hot dogs, rib eye steak, sausage, ham, and bacon. High-fat dairy items, such as whole milk, butter, and cream cheese. Eat small meals often. Avoid eating large meals. Avoid drinking large amounts of liquid with your meals. Avoid eating meals during the 2-3 hours before bedtime. Avoid lying down right after you eat. Do not exercise right after you eat. Lifestyle  Do not smoke or use any products that contain nicotine or tobacco. If you need help quitting, ask your doctor. Try to lower your stress. If you need help doing this, ask your doctor. If you are overweight, lose an amount of weight that is healthy for you. Ask your doctor about a safe weight loss goal. General instructions Pay attention to any changes in your symptoms. Take over-the-counter and prescription medicines only as told by your doctor. Do not take aspirin, ibuprofen, or other NSAIDs unless your doctor says it is okay. Wear loose clothes. Do not wear anything tight around your waist. Raise (elevate) the head of your bed about 6   inches (15 cm). You may need to use a wedge to do this. Avoid bending over if this makes your symptoms worse. Keep all follow-up visits. Contact a doctor if: You have new symptoms. You lose weight and you  do not know why. You have trouble swallowing or it hurts to swallow. You have wheezing or a cough that keeps happening. You have a hoarse voice. Your symptoms do not get better with treatment. Get help right away if: You have sudden pain in your arms, neck, jaw, teeth, or back. You suddenly feel sweaty, dizzy, or light-headed. You have chest pain or shortness of breath. You vomit and the vomit is green, yellow, or black, or it looks like blood or coffee grounds. You faint. Your poop (stool) is red, bloody, or black. You cannot swallow, drink, or eat. These symptoms may represent a serious problem that is an emergency. Do not wait to see if the symptoms will go away. Get medical help right away. Call your local emergency services (911 in the [place] Do not drive yourself to the hospital. Summary If a person has gastroesophageal reflux disease (GERD), food and stomach acid move back up into the esophagus and cause symptoms or problems such as damage to the esophagus. Treatment will depend on how bad your symptoms are. Follow a diet as told by your doctor. Take all medicines only as told by your doctor. This information is not intended to replace advice given to you by your health care provider. Make sure you discuss any questions you have with your health care provider. Document Revised: 02/17/2020 Document Reviewed: 02/17/2020 Elsevier Patient Education  2022 Elsevier Inc.  

## 2021-11-16 NOTE — Addendum Note (Signed)
Addended by: Radene Knee L on: 11/16/2021 03:13 PM ? ? Modules accepted: Orders ? ?

## 2021-11-16 NOTE — Progress Notes (Signed)
?  ?Arlyss Repress, MD ?[address]  ?[address]  ?[place], [place] [address]  ?Main: [phone]  ?Fax: [phone] ? ? ? ?Gastroenterology Consultation ? ?Referring Provider:     Lorre Munroe, NP ?Primary Care Physician:  Lorre Munroe, NP ?Primary Gastroenterologist:  Dr. Arlyss Repress ?Reason for Consultation:     Chronic GERD, epigastric pain, nausea, diarrhea ?      ? HPI:   ?JOSLYNN JAMROZ is a 26 y.o. female referred by  Lorre Munroe, NP  for consultation & management of chronic symptoms of heartburn, regurgitation, epigastric pain associated with nausea, early morning nonbloody diarrhea.  Patient reports that since age of 26, she has been having these GI symptoms which are attributed to stress.  She tried to eliminate gluten which did not make much difference in her symptoms.  Patient admits to eating fast foods on a regular basis and red meat few times a week.  She has been gradually gaining weight over the years, at her maximum weight currently.  She has 2 children, admits to some personal stress in her life, supportive spouse.  She works part-time as Interior and spatial designer.  Patient denies any nocturnal heartburn.  She is taking over-the-counter Pepcid as needed.  She denies any nocturnal diarrhea.  She does report explosive diarrhea, feeling gassy. ?She has history of B12 deficiency, currently on monthly B12 injections ? ?NSAIDs: None ? ?Antiplts/Anticoagulants/Anti thrombotics: None ? ?GI Procedures: None ?She denies any family history of celiac disease, IBD ? ?Past Medical History:  ?Diagnosis Date  ? Complication of anesthesia   ? nausea and vomiting for 3 days after delivery  ? Family history of adverse reaction to anesthesia   ? mom - PONV  ? Hypertrophy of tonsils and adenoids   ? ? ?Past Surgical History:  ?Procedure Laterality Date  ? CESAREAN SECTION    ? CESAREAN SECTION N/A 04/19/2019  ? Procedure: CESAREAN SECTION, REPEAT;  Surgeon: Ward, Elenora Fender, MD;  Location: [facility]  [facility];  Service: Obstetrics;  Laterality: N/A;  ? LAPAROSCOPIC APPENDECTOMY N/A 01/02/2020  ? Procedure: APPENDECTOMY LAPAROSCOPIC;  Surgeon: Sung Amabile, DO;  Location: [facility];  Service: General;  Laterality: N/A;  ? TONSILLECTOMY AND ADENOIDECTOMY N/A 05/06/2016  ? Procedure: TONSILLECTOMY AND ADENOIDECTOMY;  Surgeon: Linus Salmons, MD;  Location: [facility];  Service: ENT;  Laterality: N/A;  ? WISDOM TOOTH EXTRACTION    ? ? ?Current Outpatient Medications:  ?  cyanocobalamin (,VITAMIN B-12,) 1000 MCG/ML injection, Inject into the muscle., Disp: , Rfl:  ?  famotidine (PEPCID) 20 MG tablet, Take 1 tablet (20 mg total) by mouth 2 (two) times daily., Disp: 30 tablet, Rfl: 0 ?  levonorgestrel-ethinyl estradiol (ALESSE) 0.1-20 MG-MCG tablet, Take by mouth., Disp: , Rfl:  ? ? ? ?Family History  ?Problem Relation Age of Onset  ? Breast cancer Paternal Grandmother   ?  ? ?Social History  ? ?Tobacco Use  ? Smoking status: Never  ? Smokeless tobacco: Never  ?Vaping Use  ? Vaping Use: Never used  ?Substance Use Topics  ? Alcohol use: No  ? Drug use: No  ? ? ?Allergies as of 11/16/2021  ? (No Known Allergies)  ? ? ?Review of Systems:    ?All systems reviewed and negative except where noted in HPI. ? ? Physical Exam:  ?BP 118/75 (BP Location: Left Arm, Patient Position: Sitting, Cuff Size: Normal)   Pulse (!) 101   Temp 98.1 ?F (36.7 ?C) (Oral)  Ht 5\' 3"  (1.6 m)   Wt 220 lb 4 oz (99.9 kg)   BMI 39.02 kg/m?  ?No LMP recorded. ? ?General:   Alert,  Well-developed, well-nourished, pleasant and cooperative in NAD ?Head:  Normocephalic and atraumatic. ?Eyes:  Sclera clear, no icterus.   Conjunctiva pink. ?Ears:  Normal auditory acuity. ?Nose:  No deformity, discharge, or lesions. ?Mouth:  No deformity or lesions,oropharynx pink & moist. ?Neck:  Supple; no masses or thyromegaly. ?Lungs:  Respirations even and unlabored.  Clear throughout to auscultation.   No wheezes, crackles, or rhonchi. No acute distress. ?Heart:   Regular rate and rhythm; no murmurs, clicks, rubs, or gallops. ?Abdomen:  Normal bowel sounds. Soft, non-tender and non-distended without masses, hepatosplenomegaly or hernias noted.  No guarding or rebound tenderness.   ?Rectal: Not performed ?Msk:  Symmetrical without gross deformities. Good, equal movement & strength bilaterally. ?Pulses:  Normal pulses noted. ?Extremities:  No clubbing or edema.  No cyanosis. ?Neurologic:  Alert and oriented x3;  grossly normal neurologically. ?Skin:  Intact without significant lesions or rashes. No jaundice. ?Psych:  Alert and cooperative. Normal mood and affect. ? ?Imaging Studies: ?Reviewed ? ?Assessment and Plan:  ? ?JARAH PEMBER is a 26 y.o. female with history of appendectomy in 12/2019 is seen in consultation for chronic symptoms of heartburn, epigastric pain, nausea, postprandial nonbloody diarrhea, B12 deficiency without any other constitutional signs or symptoms ? ?Chronic GERD ?Discussed about antireflux lifestyle, information provided ?Start omeprazole 40 mg daily before breakfast ? ?Epigastric pain and nausea with postprandial diarrhea ?Check celiac disease panel ?Check H. pylori breath test ?Advised regarding healthy diet ?TSH normal ? ?B12 deficiency ?Likely secondary to lack of balanced diet ?Check parietal cell and intrinsic factor antibodies ? ?If above work-up is negative, recommend food allergy profile, alpha gal panel, fecal calprotectin levels, GI profile PCR ? ? ?Follow up in 3 to 4 months virtual visit, via MyChart as needed ? ? ?01/2020, MD ? ?

## 2021-11-17 ENCOUNTER — Other Ambulatory Visit: Payer: Self-pay | Admitting: Gastroenterology

## 2021-11-17 ENCOUNTER — Other Ambulatory Visit
Admission: RE | Admit: 2021-11-17 | Discharge: 2021-11-17 | Disposition: A | Discharge status: Home / Self Care | Payer: No Typology Code available for payment source | Source: Ambulatory Visit | Attending: Gastroenterology | Admitting: Gastroenterology

## 2021-11-17 ENCOUNTER — Other Ambulatory Visit
Admission: RE | Admit: 2021-11-17 | Payer: No Typology Code available for payment source | Source: Ambulatory Visit | Admitting: Gastroenterology

## 2021-11-17 DIAGNOSIS — R197 Diarrhea, unspecified: Secondary | ICD-10-CM | POA: Diagnosis not present

## 2021-11-17 DIAGNOSIS — K529 Noninfective gastroenteritis and colitis, unspecified: Secondary | ICD-10-CM

## 2021-11-17 LAB — ANTI-PARIETAL ANTIBODY: Parietal Cell Antibody-IgG: 25.6 Units — ABNORMAL HIGH (ref 0.0–20.0)

## 2021-11-17 LAB — INTRINSIC FACTOR ANTIBODIES: Intrinsic Factor: 1 AU/mL (ref 0.0–1.1)

## 2021-11-18 ENCOUNTER — Telehealth: Payer: Self-pay

## 2021-11-18 LAB — CELIAC DISEASE PANEL
Endomysial Ab, IgA: NEGATIVE
IgA: 288 mg/dL (ref 87–352)
Tissue Transglutaminase Ab, IgA: <2 U/mL (ref 0–3)

## 2021-11-18 NOTE — Telephone Encounter (Signed)
Tried to call patient but unable to reach patient on the phone., Sent mychart message  ?

## 2021-11-18 NOTE — Telephone Encounter (Signed)
-----   Message from Toney Reil, MD sent at 11/17/2021  5:04 PM EDT ----- ?Can you please call the patient and ask her to go to the lab for celiac disease panel? ?I just reordered ? ?----- Message ----- ?From: Denman George, CMA ?Sent: 11/17/2021   4:44 PM EDT ?To: Toney Reil, MD ? ?It does not look like this was drawn for patient. I do not know why it wasn't and the Hospital lab said they can not add it on.  ?----- Message ----- ?From: Toney Reil, MD ?Sent: 11/17/2021   4:12 PM EDT ?To: Denman George, CMA ? ?Morrie Sheldon ? ?I just want to make sure that the celiac disease panel is not canceled, could you please check? ? ?Thanks ?RV ? ? ?

## 2021-11-18 NOTE — Telephone Encounter (Signed)
error 

## 2021-11-19 ENCOUNTER — Encounter: Payer: Self-pay | Admitting: Gastroenterology

## 2021-11-19 LAB — H. PYLORI ANTIGEN, STOOL: H. Pylori Stool Ag, Eia: NEGATIVE

## 2021-11-22 ENCOUNTER — Other Ambulatory Visit: Payer: Self-pay

## 2021-11-22 DIAGNOSIS — D510 Vitamin B12 deficiency anemia due to intrinsic factor deficiency: Secondary | ICD-10-CM

## 2021-12-07 ENCOUNTER — Telehealth: Payer: Self-pay | Admitting: Gastroenterology

## 2021-12-07 NOTE — Telephone Encounter (Signed)
Called and left a message for call back. Called [facility]  and canceled EGD in ENDO.  ?

## 2021-12-07 NOTE — Telephone Encounter (Signed)
PT left message to cancel endoscopy  will call back to reschedule ?

## 2021-12-08 ENCOUNTER — Ambulatory Visit: Admit: 2021-12-08 | Payer: No Typology Code available for payment source | Admitting: Gastroenterology

## 2021-12-08 SURGERY — ESOPHAGOGASTRODUODENOSCOPY (EGD) WITH PROPOFOL
Anesthesia: General

## 2021-12-29 ENCOUNTER — Ambulatory Visit: Payer: Self-pay

## 2022-01-04 ENCOUNTER — Other Ambulatory Visit: Payer: Self-pay | Admitting: Family Medicine

## 2022-01-04 DIAGNOSIS — G43809 Other migraine, not intractable, without status migrainosus: Secondary | ICD-10-CM

## 2022-01-12 ENCOUNTER — Ambulatory Visit
Admission: RE | Admit: 2022-01-12 | Discharge: 2022-01-12 | Disposition: A | Discharge status: Home / Self Care | Payer: 59 | Source: Ambulatory Visit | Attending: Family Medicine | Admitting: Family Medicine

## 2022-01-12 DIAGNOSIS — G43809 Other migraine, not intractable, without status migrainosus: Secondary | ICD-10-CM

## 2022-01-12 MED ORDER — GADOBENATE DIMEGLUMINE 529 MG/ML IV SOLN
20.0000 mL | Freq: Once | INTRAVENOUS | Status: AC | PRN
  Administered 2022-01-12: 20 mL via INTRAVENOUS

## 2022-03-07 ENCOUNTER — Telehealth: Payer: No Typology Code available for payment source | Admitting: Gastroenterology

## 2022-09-23 ENCOUNTER — Encounter: Payer: Self-pay | Admitting: Otolaryngology

## 2024-07-01 ENCOUNTER — Ambulatory Visit (LOCAL_COMMUNITY_HEALTH_CENTER): Payer: Self-pay

## 2024-07-01 DIAGNOSIS — Z719 Counseling, unspecified: Secondary | ICD-10-CM

## 2024-07-01 DIAGNOSIS — Z23 Encounter for immunization: Secondary | ICD-10-CM | POA: Diagnosis not present

## 2024-07-01 NOTE — Progress Notes (Signed)
 In nurse clinic for immunization today . Pt states needs varicella vaccine d/t immunity status testing MMRV showed not immune to varicella. Pt needs vaccine for school purposes.Tolerated vaccine well R arm. VIS given and copies of [facility]. Pt's full immunization record not showing in [facility] ,but is in [facility]. Printed out copy for pt as well as varicella vaccine given today. Advised pt once able to fix discrepancy would print off new copy and if needs to pick up will have one available for her.

## 2024-07-09 ENCOUNTER — Ambulatory Visit: Payer: Self-pay

## 2024-07-09 ENCOUNTER — Ambulatory Visit (LOCAL_COMMUNITY_HEALTH_CENTER): Payer: Self-pay

## 2024-07-09 DIAGNOSIS — Z7185 Encounter for immunization safety counseling: Secondary | ICD-10-CM

## 2024-07-09 DIAGNOSIS — Z0184 Encounter for antibody response examination: Secondary | ICD-10-CM

## 2024-07-09 NOTE — Progress Notes (Signed)
 Patient seen in nurse clinic for MMR Titer. Patient reported this is for school and school told her to get the titer and not another MMR vaccine. Order for MMR Titer sent to lab. Patient paid for titer. Discussed results in My Chart and patient got assistance with My Chart from administrative staff. Release form signed by patient.

## 2024-07-10 ENCOUNTER — Ambulatory Visit: Payer: Self-pay

## 2024-07-10 LAB — MEASLES/MUMPS/RUBELLA IMMUNITY
MUMPS ABS, IGG: <9 [AU]/ml — ABNORMAL LOW (ref >10.9)
RUBEOLA AB, IGG: 213 [AU]/ml (ref >16.4)
Rubella Antibodies, IGG: 3.36 {index} (ref >0.99)

## 2024-07-10 NOTE — Progress Notes (Signed)
 Attempted to contact pt via phone at # [phone]. No answer. LMTRC.   Per MMR titer results-Rubella IGG -immune,Rubeolla IGG immune, Mumps IGG is negative .Per S.O. recommends MMR vaccine.

## 2024-07-11 NOTE — Progress Notes (Signed)
 Sent message to pt via MyChart . Pt read message on 07/10/2024.

## 2024-07-15 ENCOUNTER — Ambulatory Visit

## 2024-07-30 ENCOUNTER — Ambulatory Visit

## 2024-07-30 ENCOUNTER — Ambulatory Visit (LOCAL_COMMUNITY_HEALTH_CENTER)

## 2024-07-30 DIAGNOSIS — Z7185 Encounter for immunization safety counseling: Secondary | ICD-10-CM

## 2024-07-30 DIAGNOSIS — Z23 Encounter for immunization: Secondary | ICD-10-CM | POA: Diagnosis not present

## 2024-07-30 NOTE — Progress Notes (Signed)
 In nurse clinic for MMR vaccine.   Patient had low MMR titer on 07/09/2024. Per [facility], documentation of one MMR. Per patient, no other vaccine record. Needs MMR for nursing school.   Counseled on MMR.  MMR given and tolerated well. Updated [facility] copy given and reviewed. Zaveon Gillen, RN
